# Patient Record
Sex: Female | Born: 1990 | Hispanic: Refuse to answer | Marital: Single | Smoking: Never smoker
Health system: Southern US, Community
[De-identification: ages and names within clinical notes are randomized; demographics above are authoritative.]

## PROBLEM LIST (undated history)

## (undated) DIAGNOSIS — J45909 Unspecified asthma, uncomplicated: Secondary | ICD-10-CM

---
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
## 2013-04-18 ENCOUNTER — Emergency Department: Payer: Self-pay | Admitting: Emergency Medicine

## 2014-08-04 ENCOUNTER — Encounter: Payer: Self-pay | Admitting: Internal Medicine

## 2014-08-04 DIAGNOSIS — K9041 Non-celiac gluten sensitivity: Secondary | ICD-10-CM | POA: Insufficient documentation

## 2014-10-04 ENCOUNTER — Encounter: Payer: Self-pay | Admitting: Internal Medicine

## 2014-10-04 ENCOUNTER — Ambulatory Visit (INDEPENDENT_AMBULATORY_CARE_PROVIDER_SITE_OTHER): Payer: 59 | Admitting: Internal Medicine

## 2014-10-04 VITALS — BP 96/62 | HR 68 | Ht 61.0 in | Wt 252.6 lb

## 2014-10-04 DIAGNOSIS — E669 Obesity, unspecified: Secondary | ICD-10-CM

## 2014-10-04 DIAGNOSIS — J4520 Mild intermittent asthma, uncomplicated: Secondary | ICD-10-CM | POA: Diagnosis not present

## 2014-10-04 DIAGNOSIS — Z0000 Encounter for general adult medical examination without abnormal findings: Secondary | ICD-10-CM | POA: Diagnosis not present

## 2014-10-04 DIAGNOSIS — Z30013 Encounter for initial prescription of injectable contraceptive: Secondary | ICD-10-CM

## 2014-10-04 MED ORDER — ALBUTEROL SULFATE HFA 108 (90 BASE) MCG/ACT IN AERS: 2.0000 | INHALATION_SPRAY | Freq: Four times a day (QID) | RESPIRATORY_TRACT | Status: DC | PRN

## 2014-10-04 MED ORDER — TRIAMCINOLONE ACETONIDE 0.5 % EX CREA: 1.0000 "application " | TOPICAL_CREAM | Freq: Two times a day (BID) | CUTANEOUS | Status: DC

## 2014-10-04 MED ORDER — MEDROXYPROGESTERONE ACETATE 150 MG/ML IM SUSP: 150.0000 mg | Freq: Once | INTRAMUSCULAR | Status: DC

## 2014-10-04 NOTE — Progress Notes (Signed)
Date:  10/04/2014   Name:  Cassandra Barajas   DOB:  03-09-1991   MRN:  [id number]   Chief Complaint: Annual Exam Cassandra Barajas is a 24 y.o. female who presents today for her Complete Annual Exam. She feels well. She reports exercising - walking every day. She reports she is sleeping well. She needs to start some type of contraception - condoms are not working out and she has had to take Plan B twice. She has used Depo Provera in the past with good effect.  She does not want another child for at least 5 years.  She is also planning to have bariatric surgery - gastric sleeve - in December and needs an EKG. Asthma There is no chest tightness, cough, difficulty breathing, shortness of breath or wheezing. This is a recurrent problem. The current episode started more than 1 year ago. The problem occurs intermittently. The problem has been waxing and waning. Pertinent negatives include no appetite change, chest pain, ear pain, fever, headaches, sore throat or trouble swallowing. Her symptoms are aggravated by change in weather. Her symptoms are alleviated by beta-agonist. Her past medical history is significant for asthma.  Rash This is a recurrent problem. The current episode started more than 1 year ago. The problem has been waxing and waning since onset. The affected locations include the abdomen. The rash is characterized by dryness, itchiness and scaling. Associated with: from belt buckles and snaps on jeans. Pertinent negatives include no cough, diarrhea, fever, shortness of breath or sore throat. Her past medical history is significant for asthma.     Review of Systems:  Review of Systems  Constitutional: Negative for fever, activity change, appetite change and unexpected weight change.  HENT: Negative for ear discharge, ear pain, hearing loss, sore throat and trouble swallowing.   Eyes: Negative for visual disturbance.  Respiratory: Negative for cough, choking, chest tightness, shortness of breath and  wheezing.   Cardiovascular: Negative for chest pain, palpitations and leg swelling.  Gastrointestinal: Negative for abdominal pain, diarrhea and constipation.  Genitourinary: Positive for pelvic pain (cramps with menses). Negative for dysuria, urgency, hematuria and vaginal discharge.  Musculoskeletal: Negative for back pain and arthralgias.  Skin: Positive for rash (psoriasis).  Neurological: Negative for numbness and headaches.  Hematological: Negative for adenopathy.  Psychiatric/Behavioral: Negative for sleep disturbance and dysphoric mood.    Patient Active Problem List   Diagnosis Date Noted  . Asthma, mild intermittent 08/04/2014  . NCGS (non-celiac gluten sensitivity) 08/04/2014    Prior to Admission medications   Medication Sig Start Date End Date Taking? Authorizing Provider  albuterol (PROVENTIL HFA;VENTOLIN HFA) 108 (90 BASE) MCG/ACT inhaler Inhale 2 puffs into the lungs 4 (four) times daily as needed. 08/13/13  Yes Historical Provider, MD  triamcinolone cream (KENALOG) 0.5 % Apply 1 application topically 2 (two) times daily. 08/13/13  Yes Historical Provider, MD    No Known Allergies  Past Surgical History  Procedure Laterality Date  . Cholecystectomy  09/2008    History  Substance Use Topics  . Smoking status: Never Smoker   . Smokeless tobacco: Not on file  . Alcohol Use: 0.0 oz/week    0 Standard drinks or equivalent per week     Medication list has been reviewed and updated.  Physical Examination:  Physical Exam  Constitutional: She is oriented to person, place, and time. She appears well-developed. No distress.  HENT:  Head: Normocephalic and atraumatic.  Mouth/Throat: Oropharynx is clear and moist.  Eyes: Conjunctivae are normal.  Pupils are equal, round, and reactive to light. Right eye exhibits no discharge. Left eye exhibits no discharge. No scleral icterus.  Neck: Neck supple. No thyromegaly present.  Cardiovascular: Normal rate, regular rhythm and  normal heart sounds.   No murmur heard. Pulmonary/Chest: Effort normal and breath sounds normal. No respiratory distress. She has no wheezes. She has no rales.  Abdominal: Soft. Bowel sounds are normal. There is no tenderness. There is no rebound and no guarding.  Genitourinary: Vagina normal. No breast swelling, tenderness, discharge (piercings of both nipples - intact) or bleeding. Pelvic exam was performed with patient supine. There is no tenderness or lesion on the right labia. There is no tenderness or lesion on the left labia. Cervix exhibits no motion tenderness. Right adnexum displays no tenderness and no fullness. Left adnexum displays no tenderness and no fullness.  Difficult to visualize cervix due to anterior position and depth of vaginal vault.  Several passes made with cervical broom.  Musculoskeletal: Normal range of motion. She exhibits no edema or tenderness.  Lymphadenopathy:    She has no cervical adenopathy.  Neurological: She is alert and oriented to person, place, and time. She has normal reflexes.  Skin: Skin is warm and dry. No rash noted.     Psychiatric: She has a normal mood and affect. Her speech is normal and behavior is normal. Thought content normal.  Nursing note and vitals reviewed.   BP 96/62 mmHg  Pulse 68  Ht 5\' 1"  (1.549 m)  Wt 252 lb 9.6 oz (114.579 kg)  BMI 47.75 kg/m2  Assessment and Plan: 1. Annual physical exam Pap obtained - unsure of the adequacy of specimen (may need to go to GYN) Mild dermatitis from nickel - use TAC as needed - POCT urinalysis dipstick - Pap IG (Image Guided) - Comprehensive metabolic panel - TSH - EKG 12-Lead - SR @ 71; low voltage otherwise normal with no prior to compare  2. Asthma, mild intermittent, uncomplicated stable - CBC with Differential/Platelet - albuterol (PROVENTIL HFA;VENTOLIN HFA) 108 (90 BASE) MCG/ACT inhaler; Inhale 2 puffs into the lungs 4 (four) times daily as needed.  Dispense: 18 g; Refill:  5  3. Obesity Plan for Gastric sleeve  4. Encounter for initial prescription of injectable contraceptive Pt will return with Rx - will need negative urine pregnancy test and then begin injection every 90 days - medroxyPROGESTERone (DEPO-PROVERA) 150 MG/ML injection; Inject 1 mL (150 mg total) into the muscle once.  Dispense: 1 mL; Refill: 3   Bari Edward, MD [facility]  10/04/2014

## 2014-10-04 NOTE — Patient Instructions (Signed)

## 2014-10-05 LAB — CBC WITH DIFFERENTIAL/PLATELET
Basophils Absolute: 0 10*3/uL (ref 0.0–0.2)
Basos: 0 %
EOS (ABSOLUTE): 0.5 10*3/uL — ABNORMAL HIGH (ref 0.0–0.4)
Eos: 4 %
Hematocrit: 36.6 % (ref 34.0–46.6)
Hemoglobin: 12.2 g/dL (ref 11.1–15.9)
Immature Grans (Abs): 0 10*3/uL (ref 0.0–0.1)
Immature Granulocytes: 0 %
Lymphocytes Absolute: 2.7 10*3/uL (ref 0.7–3.1)
Lymphs: 24 %
MCH: 26.5 pg — ABNORMAL LOW (ref 26.6–33.0)
MCHC: 33.3 g/dL (ref 31.5–35.7)
MCV: 79 fL (ref 79–97)
Monocytes Absolute: 0.7 10*3/uL (ref 0.1–0.9)
Monocytes: 6 %
Neutrophils Absolute: 7.2 10*3/uL — ABNORMAL HIGH (ref 1.4–7.0)
Neutrophils: 66 %
Platelets: 354 10*3/uL (ref 150–379)
RBC: 4.61 x10E6/uL (ref 3.77–5.28)
RDW: 15.1 % (ref 12.3–15.4)
WBC: 11.1 10*3/uL — ABNORMAL HIGH (ref 3.4–10.8)

## 2014-10-05 LAB — PAP IG (IMAGE GUIDED): PAP Smear Comment: 0

## 2014-10-05 LAB — COMPREHENSIVE METABOLIC PANEL
Chloride: 102 mmol/L (ref 97–108)
Potassium: 4.4 mmol/L (ref 3.5–5.2)

## 2014-10-05 LAB — TSH: TSH: 2.29 u[IU]/mL (ref 0.450–4.500)

## 2014-10-19 ENCOUNTER — Ambulatory Visit (INDEPENDENT_AMBULATORY_CARE_PROVIDER_SITE_OTHER): Payer: 59

## 2014-10-19 DIAGNOSIS — Z30013 Encounter for initial prescription of injectable contraceptive: Secondary | ICD-10-CM

## 2014-10-19 LAB — POCT URINE PREGNANCY: Preg Test, Ur: NEGATIVE

## 2014-10-19 MED ORDER — MEDROXYPROGESTERONE ACETATE 150 MG/ML IM SUSP
150.0000 mg | Freq: Once | INTRAMUSCULAR | Status: AC
  Administered 2014-10-19: 150 mg via INTRAMUSCULAR

## 2015-01-18 ENCOUNTER — Other Ambulatory Visit (INDEPENDENT_AMBULATORY_CARE_PROVIDER_SITE_OTHER): Payer: 59

## 2015-01-18 ENCOUNTER — Other Ambulatory Visit: Payer: Self-pay | Admitting: Internal Medicine

## 2015-01-18 DIAGNOSIS — Z30013 Encounter for initial prescription of injectable contraceptive: Secondary | ICD-10-CM

## 2015-01-18 DIAGNOSIS — Z3040 Encounter for surveillance of contraceptives, unspecified: Secondary | ICD-10-CM

## 2015-01-18 MED ORDER — MEDROXYPROGESTERONE ACETATE 150 MG/ML IM SUSP
150.0000 mg | Freq: Once | INTRAMUSCULAR | Status: AC
  Administered 2015-01-18: 150 mg via INTRAMUSCULAR

## 2015-01-19 ENCOUNTER — Other Ambulatory Visit: Payer: Self-pay

## 2015-04-20 ENCOUNTER — Other Ambulatory Visit: Payer: Self-pay | Admitting: Internal Medicine

## 2015-04-20 ENCOUNTER — Ambulatory Visit (INDEPENDENT_AMBULATORY_CARE_PROVIDER_SITE_OTHER): Payer: 59

## 2015-04-20 ENCOUNTER — Ambulatory Visit: Payer: Self-pay

## 2015-04-20 DIAGNOSIS — Z3042 Encounter for surveillance of injectable contraceptive: Secondary | ICD-10-CM

## 2015-04-20 DIAGNOSIS — N3281 Overactive bladder: Secondary | ICD-10-CM | POA: Insufficient documentation

## 2015-04-20 MED ORDER — OXYBUTYNIN CHLORIDE ER 5 MG PO TB24: 5.0000 mg | ORAL_TABLET | Freq: Every day | ORAL | Status: DC

## 2015-04-20 MED ORDER — MEDROXYPROGESTERONE ACETATE 150 MG/ML IM SUSP
150.0000 mg | Freq: Once | INTRAMUSCULAR | Status: AC
  Administered 2015-04-20: 150 mg via INTRAMUSCULAR

## 2015-04-20 NOTE — Addendum Note (Signed)
Addended by: Letta KocherHADLEY, Becker Christopher A on: 04/20/2015 11:51 AM   Modules accepted: SmartSet

## 2015-05-15 ENCOUNTER — Encounter
Admission: RE | Admit: 2015-05-15 | Discharge: 2015-05-15 | Disposition: A | Discharge status: Home / Self Care | Payer: BLUE CROSS/BLUE SHIELD | Source: Ambulatory Visit | Attending: Bariatrics | Admitting: Bariatrics

## 2015-05-15 DIAGNOSIS — Z01812 Encounter for preprocedural laboratory examination: Secondary | ICD-10-CM | POA: Insufficient documentation

## 2015-05-15 HISTORY — DX: Unspecified asthma, uncomplicated: J45.909

## 2015-05-15 NOTE — Patient Instructions (Signed)
  Your procedure is scheduled on: June 06, 2015 (Tuesday) Report to Day Surgery.[facility]) Second Floor To find out your arrival time please call [phone] between 1PM - 3PM on June 04, 2105 (Monday).  Remember: Instructions that are not followed completely may result in serious medical risk, up to and including death, or upon the discretion of your surgeon and anesthesiologist your surgery may need to be rescheduled.    __x__ 1. Do not eat food or drink liquids after midnight. No gum chewing or hard candies.     __x__ 2. No Alcohol for 24 hours before or after surgery.   ____ 3. Bring all medications with you on the day of surgery if instructed.    __x__ 4. Notify your doctor if there is any change in your medical condition     (cold, fever, infections).     Do not wear jewelry, make-up, hairpins, clips or nail polish.  Do not wear lotions, powders, or perfumes. You may wear deodorant.  Do not shave 48 hours prior to surgery. Men may shave face and neck.  Do not bring valuables to the hospital.    [facility] is not responsible for any belongings or valuables.               Contacts, dentures or bridgework may not be worn into surgery.  Leave your suitcase in the car. After surgery it may be brought to your room.  For patients admitted to the hospital, discharge time is determined by your                treatment team.   Patients discharged the day of surgery will not be allowed to drive home.   Please read over the following fact sheets that you were given:   Surgical Site Infection Prevention   ____ Take these medicines the morning of surgery with A SIP OF WATER:    1.       ____ Fleet Enema (as directed)   ____ Use CHG Soap as directed  __x__ Use inhalers on the day of surgery (Use Albuterol inhaler the morning of surgery and bring to hospital)  ____ Stop metformin 2 days prior to surgery    ____ Take 1/2 of usual insulin dose the night before surgery  and none on the morning of surgery.   ____ Stop Coumadin/Plavix/aspirin on   __x__ Stop Anti-inflammatories on  (NO NSAIDS)   ____ Stop supplements until after surgery.    ____ Bring C-Pap to the hospital.

## 2015-05-17 HISTORY — PX: OTHER SURGICAL HISTORY: SHX169

## 2015-05-19 NOTE — Pre-Procedure Instructions (Signed)
Several attempts to enter Emend order in epic  without success, pharmacy notified and stated Dr. Alva Garnet could enter the medication thru the data base or call the order  to [facility] pharmacist to enter the medication. Dr. Alva Garnet office notified concerning Emend (spoke to [place]).

## 2015-05-23 ENCOUNTER — Inpatient Hospital Stay: Payer: BLUE CROSS/BLUE SHIELD | Admitting: Anesthesiology

## 2015-05-23 ENCOUNTER — Inpatient Hospital Stay
Admission: RE | Admit: 2015-05-23 | Discharge: 2015-05-24 | DRG: 621 | Disposition: A | Discharge status: Home / Self Care | Payer: BLUE CROSS/BLUE SHIELD | Source: Ambulatory Visit | Attending: Bariatrics | Admitting: Bariatrics

## 2015-05-23 ENCOUNTER — Encounter
Admission: RE | Disposition: A | Discharge status: Home / Self Care | Payer: Self-pay | Source: Ambulatory Visit | Attending: Bariatrics

## 2015-05-23 DIAGNOSIS — K219 Gastro-esophageal reflux disease without esophagitis: Secondary | ICD-10-CM | POA: Diagnosis present

## 2015-05-23 DIAGNOSIS — Z7901 Long term (current) use of anticoagulants: Secondary | ICD-10-CM | POA: Diagnosis not present

## 2015-05-23 DIAGNOSIS — K449 Diaphragmatic hernia without obstruction or gangrene: Secondary | ICD-10-CM | POA: Diagnosis present

## 2015-05-23 DIAGNOSIS — Z5181 Encounter for therapeutic drug level monitoring: Secondary | ICD-10-CM

## 2015-05-23 DIAGNOSIS — Z6841 Body Mass Index (BMI) 40.0 and over, adult: Secondary | ICD-10-CM

## 2015-05-23 HISTORY — PX: LAPAROSCOPIC GASTRIC SLEEVE RESECTION WITH HIATAL HERNIA REPAIR: SHX6512

## 2015-05-23 LAB — TYPE AND SCREEN
ABO/RH(D): O POS
Antibody Screen: NEGATIVE

## 2015-05-23 LAB — HEMOGLOBIN AND HEMATOCRIT, BLOOD
HCT: 35.1 % (ref 35.0–47.0)
Hemoglobin: 11.9 g/dL — ABNORMAL LOW (ref 12.0–16.0)

## 2015-05-23 LAB — POCT PREGNANCY, URINE: Preg Test, Ur: NEGATIVE

## 2015-05-23 LAB — ABO/RH: ABO/RH(D): O POS

## 2015-05-23 SURGERY — GASTRECTOMY, SLEEVE, LAPAROSCOPIC, WITH HIATAL HERNIA REPAIR
Anesthesia: General | Wound class: Clean Contaminated

## 2015-05-23 MED ORDER — OXYBUTYNIN CHLORIDE ER 5 MG PO TB24
5.0000 mg | ORAL_TABLET | Freq: Every day | ORAL | Status: DC
  Administered 2015-05-23: 5 mg via ORAL
  Filled 2015-05-23: qty 1

## 2015-05-23 MED ORDER — TRIAMCINOLONE ACETONIDE 0.5 % EX CREA
1.0000 "application " | TOPICAL_CREAM | Freq: Two times a day (BID) | CUTANEOUS | Status: DC
  Filled 2015-05-23: qty 15

## 2015-05-23 MED ORDER — POTASSIUM CHLORIDE IN NACL 20-0.45 MEQ/L-% IV SOLN
INTRAVENOUS | Status: DC
  Administered 2015-05-23 – 2015-05-24 (×3): via INTRAVENOUS
  Filled 2015-05-23 (×8): qty 1000

## 2015-05-23 MED ORDER — SCOPOLAMINE 1 MG/3DAYS TD PT72
MEDICATED_PATCH | TRANSDERMAL | Status: AC
  Administered 2015-05-23: 1.5 mg via TRANSDERMAL
  Filled 2015-05-23: qty 1

## 2015-05-23 MED ORDER — BUPIVACAINE-EPINEPHRINE (PF) 0.25% -1:200000 IJ SOLN
INTRAMUSCULAR | Status: AC
  Filled 2015-05-23: qty 60

## 2015-05-23 MED ORDER — ACETAMINOPHEN 160 MG/5ML PO SOLN
650.0000 mg | ORAL | Status: DC | PRN
  Filled 2015-05-23: qty 20.3

## 2015-05-23 MED ORDER — FENTANYL CITRATE (PF) 100 MCG/2ML IJ SOLN
INTRAMUSCULAR | Status: AC
  Administered 2015-05-23: 25 ug via INTRAVENOUS
  Filled 2015-05-23: qty 2

## 2015-05-23 MED ORDER — PROPOFOL 10 MG/ML IV BOLUS
INTRAVENOUS | Status: DC | PRN
  Administered 2015-05-23: 180 mg via INTRAVENOUS

## 2015-05-23 MED ORDER — BUPIVACAINE-EPINEPHRINE (PF) 0.25% -1:200000 IJ SOLN
INTRAMUSCULAR | Status: DC | PRN
  Administered 2015-05-23: 45 mL

## 2015-05-23 MED ORDER — CEFAZOLIN SODIUM-DEXTROSE 2-3 GM-% IV SOLR
2.0000 g | Freq: Once | INTRAVENOUS | Status: AC
  Administered 2015-05-23: 2 g via INTRAVENOUS

## 2015-05-23 MED ORDER — LACTATED RINGERS IV SOLN
INTRAVENOUS | Status: DC
  Administered 2015-05-23: 11:00:00 via INTRAVENOUS

## 2015-05-23 MED ORDER — ONDANSETRON HCL 4 MG/2ML IJ SOLN
INTRAMUSCULAR | Status: DC | PRN
  Administered 2015-05-23: 4 mg via INTRAVENOUS

## 2015-05-23 MED ORDER — CETIRIZINE HCL 10 MG PO TABS
10.0000 mg | ORAL_TABLET | Freq: Every evening | ORAL | Status: DC
  Filled 2015-05-23: qty 1

## 2015-05-23 MED ORDER — PNEUMOCOCCAL VAC POLYVALENT 25 MCG/0.5ML IJ INJ
0.5000 mL | INJECTION | INTRAMUSCULAR | Status: AC
  Administered 2015-05-24: 0.5 mL via INTRAMUSCULAR
  Filled 2015-05-23: qty 0.5

## 2015-05-23 MED ORDER — HYDROMORPHONE HCL 1 MG/ML IJ SOLN
1.0000 mg | INTRAMUSCULAR | Status: DC | PRN
  Administered 2015-05-23: 2 mg via INTRAVENOUS
  Administered 2015-05-23 – 2015-05-24 (×3): 1 mg via INTRAVENOUS
  Filled 2015-05-23 (×2): qty 1
  Filled 2015-05-23: qty 2
  Filled 2015-05-23: qty 1

## 2015-05-23 MED ORDER — ALBUTEROL SULFATE (2.5 MG/3ML) 0.083% IN NEBU: 2.5000 mg | INHALATION_SOLUTION | Freq: Four times a day (QID) | RESPIRATORY_TRACT | Status: DC | PRN

## 2015-05-23 MED ORDER — ROCURONIUM BROMIDE 100 MG/10ML IV SOLN
INTRAVENOUS | Status: DC | PRN
  Administered 2015-05-23: 40 mg via INTRAVENOUS
  Administered 2015-05-23: 20 mg via INTRAVENOUS

## 2015-05-23 MED ORDER — PANTOPRAZOLE SODIUM 40 MG IV SOLR
40.0000 mg | Freq: Every day | INTRAVENOUS | Status: DC
  Administered 2015-05-23: 40 mg via INTRAVENOUS
  Filled 2015-05-23: qty 40

## 2015-05-23 MED ORDER — SUCCINYLCHOLINE CHLORIDE 20 MG/ML IJ SOLN
INTRAMUSCULAR | Status: DC | PRN
  Administered 2015-05-23: 100 mg via INTRAVENOUS

## 2015-05-23 MED ORDER — DEXAMETHASONE SODIUM PHOSPHATE 10 MG/ML IJ SOLN
INTRAMUSCULAR | Status: DC | PRN
  Administered 2015-05-23: 10 mg via INTRAVENOUS

## 2015-05-23 MED ORDER — SCOPOLAMINE 1 MG/3DAYS TD PT72
1.0000 | MEDICATED_PATCH | TRANSDERMAL | Status: DC
  Administered 2015-05-23: 1.5 mg via TRANSDERMAL

## 2015-05-23 MED ORDER — FENTANYL CITRATE (PF) 100 MCG/2ML IJ SOLN
25.0000 ug | INTRAMUSCULAR | Status: AC | PRN
  Administered 2015-05-23 (×6): 25 ug via INTRAVENOUS

## 2015-05-23 MED ORDER — PROMETHAZINE HCL 25 MG/ML IJ SOLN: 6.2500 mg | Freq: Once | INTRAMUSCULAR | Status: DC

## 2015-05-23 MED ORDER — DIPHENHYDRAMINE HCL 50 MG/ML IJ SOLN
25.0000 mg | Freq: Four times a day (QID) | INTRAMUSCULAR | Status: DC | PRN
  Administered 2015-05-23: 25 mg via INTRAVENOUS
  Filled 2015-05-23: qty 1

## 2015-05-23 MED ORDER — ONDANSETRON HCL 4 MG/2ML IJ SOLN
4.0000 mg | INTRAMUSCULAR | Status: DC | PRN
  Filled 2015-05-23: qty 2

## 2015-05-23 MED ORDER — NEOSTIGMINE METHYLSULFATE 10 MG/10ML IV SOLN
INTRAVENOUS | Status: DC | PRN
Start: 2015-05-23 — End: 2015-05-23
  Administered 2015-05-23: 4 mg via INTRAVENOUS

## 2015-05-23 MED ORDER — APREPITANT 40 MG PO CAPS: 40.0000 mg | ORAL_CAPSULE | Freq: Once | ORAL | Status: DC

## 2015-05-23 MED ORDER — ENALAPRILAT 1.25 MG/ML IV SOLN
1.2500 mg | Freq: Four times a day (QID) | INTRAVENOUS | Status: DC | PRN
  Filled 2015-05-23: qty 1

## 2015-05-23 MED ORDER — FENTANYL CITRATE (PF) 100 MCG/2ML IJ SOLN
INTRAMUSCULAR | Status: AC
  Filled 2015-05-23: qty 2

## 2015-05-23 MED ORDER — MIDAZOLAM HCL 2 MG/2ML IJ SOLN
INTRAMUSCULAR | Status: DC | PRN
  Administered 2015-05-23: 2 mg via INTRAVENOUS

## 2015-05-23 MED ORDER — GLYCOPYRROLATE 0.2 MG/ML IJ SOLN
INTRAMUSCULAR | Status: DC | PRN
  Administered 2015-05-23: 0.6 mg via INTRAVENOUS

## 2015-05-23 MED ORDER — FENTANYL CITRATE (PF) 100 MCG/2ML IJ SOLN
INTRAMUSCULAR | Status: DC | PRN
  Administered 2015-05-23: 100 ug via INTRAVENOUS
  Administered 2015-05-23: 150 ug via INTRAVENOUS
  Administered 2015-05-23: 50 ug via INTRAVENOUS

## 2015-05-23 MED ORDER — PREMIER PROTEIN SHAKE: 2.0000 [oz_av] | Freq: Four times a day (QID) | ORAL | Status: DC

## 2015-05-23 MED ORDER — HYDROCODONE-ACETAMINOPHEN 7.5-325 MG/15ML PO SOLN
5.0000 mL | ORAL | Status: DC | PRN
  Administered 2015-05-24: 10 mL via ORAL
  Filled 2015-05-23: qty 15

## 2015-05-23 MED ORDER — PROMETHAZINE HCL 25 MG/ML IJ SOLN: 12.5000 mg | Freq: Four times a day (QID) | INTRAMUSCULAR | Status: DC | PRN

## 2015-05-23 MED ORDER — PROMETHAZINE HCL 25 MG/ML IJ SOLN
INTRAMUSCULAR | Status: AC
  Filled 2015-05-23: qty 1

## 2015-05-23 MED ORDER — CEFAZOLIN SODIUM-DEXTROSE 2-3 GM-% IV SOLR
INTRAVENOUS | Status: AC
  Administered 2015-05-23: 2 g via INTRAVENOUS
  Filled 2015-05-23: qty 50

## 2015-05-23 MED ORDER — ENOXAPARIN SODIUM 40 MG/0.4ML ~~LOC~~ SOLN
40.0000 mg | Freq: Two times a day (BID) | SUBCUTANEOUS | Status: DC
  Administered 2015-05-24: 40 mg via SUBCUTANEOUS
  Filled 2015-05-23: qty 0.4

## 2015-05-23 MED ORDER — LIDOCAINE HCL (CARDIAC) 20 MG/ML IV SOLN
INTRAVENOUS | Status: DC | PRN
  Administered 2015-05-23: 10 mg via INTRAVENOUS

## 2015-05-23 MED ORDER — ONDANSETRON HCL 4 MG/2ML IJ SOLN
INTRAMUSCULAR | Status: AC
  Administered 2015-05-23: 4 mg via INTRAVENOUS
  Filled 2015-05-23: qty 2

## 2015-05-23 MED ORDER — ONDANSETRON HCL 4 MG/2ML IJ SOLN
4.0000 mg | Freq: Once | INTRAMUSCULAR | Status: AC | PRN
  Administered 2015-05-23: 4 mg via INTRAVENOUS

## 2015-05-23 SURGICAL SUPPLY — 42 items
APPLIER CLIP ROT 10 11.4 M/L (STAPLE)
BANDAGE ELASTIC 6 LF NS (GAUZE/BANDAGES/DRESSINGS) ×6 IMPLANT
BLADE SURG SZ11 CARB STEEL (BLADE) ×3 IMPLANT
CANISTER SUCT 1200ML W/VALVE (MISCELLANEOUS) ×3 IMPLANT
CHLORAPREP W/TINT 26ML (MISCELLANEOUS) ×6 IMPLANT
CLIP APPLIE ROT 10 11.4 M/L (STAPLE) IMPLANT
DEFOGGER SCOPE WARMER CLEARIFY (MISCELLANEOUS) ×3 IMPLANT
DRAPE UTILITY 15X26 TOWEL STRL (DRAPES) ×6 IMPLANT
ENDOLOOP SUT PDS II  0 18 (SUTURE)
ENDOLOOP SUT PDS II 0 18 (SUTURE) IMPLANT
FILTER LAP SMOKE EVAC STRL (MISCELLANEOUS) ×3 IMPLANT
GLOVE BIO SURGEON STRL SZ7 (GLOVE) ×6 IMPLANT
GLOVE BIOGEL PI IND STRL 8.5 (GLOVE) ×1 IMPLANT
GLOVE BIOGEL PI INDICATOR 8.5 (GLOVE) ×2
GLOVE SURG SYN 8.0 (GLOVE) ×3 IMPLANT
GOWN STRL REUS W/ TWL LRG LVL3 (GOWN DISPOSABLE) ×5 IMPLANT
GOWN STRL REUS W/TWL LRG LVL3 (GOWN DISPOSABLE) ×10
GRASPER SUT TROCAR 14GX15 (MISCELLANEOUS) ×3 IMPLANT
IRRIGATION STRYKERFLOW (MISCELLANEOUS) ×1 IMPLANT
IRRIGATOR STRYKERFLOW (MISCELLANEOUS) ×3
IV NS 1000ML (IV SOLUTION) ×2
IV NS 1000ML BAXH (IV SOLUTION) ×1 IMPLANT
KIT RM TURNOVER STRD PROC AR (KITS) ×3 IMPLANT
LABEL OR SOLS (LABEL) ×3 IMPLANT
LIQUID BAND (GAUZE/BANDAGES/DRESSINGS) ×6 IMPLANT
NDL SAFETY 22GX1.5 (NEEDLE) ×3 IMPLANT
NS IRRIG 500ML POUR BTL (IV SOLUTION) ×3 IMPLANT
PACK LAP CHOLECYSTECTOMY (MISCELLANEOUS) ×3 IMPLANT
RELOAD BLUE (STAPLE) ×9 IMPLANT
RELOAD GOLD (STAPLE) ×12 IMPLANT
RELOAD GREEN (STAPLE) ×3 IMPLANT
SHEARS HARMONIC ACE PLUS 45CM (MISCELLANEOUS) ×3 IMPLANT
SLEEVE ENDOPATH XCEL 5M (ENDOMECHANICALS) ×9 IMPLANT
SLEEVE GASTRECTOMY 36FR VISIGI (MISCELLANEOUS) ×3 IMPLANT
STAPLER ECHELON LONG 60 440 (INSTRUMENTS) ×3 IMPLANT
SUT DEVICE BRAIDED 0X39 (SUTURE) ×3 IMPLANT
SUT MNCRL AB 4-0 PS2 18 (SUTURE) ×3 IMPLANT
SUT VIC AB 0 CT2 27 (SUTURE) ×3 IMPLANT
SYR 20CC LL (SYRINGE) ×3 IMPLANT
TROCAR XCEL 12X100 BLDLESS (ENDOMECHANICALS) ×3 IMPLANT
TROCAR XCEL NON-BLD 5MMX100MML (ENDOMECHANICALS) ×3 IMPLANT
TUBING INSUFFLATOR HEATED (MISCELLANEOUS) ×3 IMPLANT

## 2015-05-23 NOTE — Transfer of Care (Signed)
Immediate Anesthesia Transfer of Care Note  Patient: Cassandra Barajas  Procedure(s) Performed: Procedure(s): LAPAROSCOPIC GASTRIC SLEEVE RESECTION WITH HIATAL HERNIA REPAIR (N/A)  Patient Location: PACU  Anesthesia Type:General  Level of Consciousness: patient cooperative and lethargic  Airway & Oxygen Therapy: Patient Spontanous Breathing and Patient connected to face mask oxygen  Post-op Assessment: Report given to RN and Post -op Vital signs reviewed and stable  Post vital signs: Reviewed and stable  Last Vitals:  Filed Vitals:   05/23/15 1021 05/23/15 1416  BP: 127/84 126/69  Pulse: 103 103  Temp: 37.2 C 36.3 C  Resp: 16 18    Complications: No apparent anesthesia complications

## 2015-05-23 NOTE — Brief Op Note (Signed)
05/23/2015  2:25 PM  PATIENT:  Cassandra Barajas  25 y.o. female  PRE-OPERATIVE DIAGNOSIS:  morbid obesity  POST-OPERATIVE DIAGNOSIS:  morbid obesity with Hiatal Hernia  PROCEDURE:  Procedure(s): LAPAROSCOPIC GASTRIC SLEEVE RESECTION WITH HIATAL HERNIA REPAIR (N/A)  SURGEON:  Surgeon(s) and Role:    * Everette Rank, MD - Primary  PHYSICIAN ASSISTANT: Waverley Krempasky, PA-C  ANESTHESIA:   general  EBL:  Total I/O In: 1200 [I.V.:1200] Out: 10 [Blood:10]  BLOOD ADMINISTERED:none  DRAINS: none   LOCAL MEDICATIONS USED:  MARCAINE     SPECIMEN:  Source of Specimen:  Greater Curvature of Stomach, Resected  DISPOSITION OF SPECIMEN:  PATHOLOGY  COUNTS:  YES  TOURNIQUET:  * No tourniquets in log *  DICTATION: .Note written in EPIC  PLAN OF CARE: Admit to inpatient   PATIENT DISPOSITION:  PACU - hemodynamically stable.   Delay start of Pharmacological VTE agent (>24hrs) due to surgical blood loss or risk of bleeding: no

## 2015-05-23 NOTE — OR Nursing (Signed)
Dr. Alva Garnet informed of patients weight discrepancy, the fact that she found out yesterday regarding surgery and has not started on the liquid protein diet.  Also, Emend is not a Surveyor, minerals medicine so we can not provide that for her.

## 2015-05-23 NOTE — Anesthesia Procedure Notes (Signed)
Procedure Name: Intubation Date/Time: 05/23/2015 12:17 PM Performed by: Omer Jack Pre-anesthesia Checklist: Patient identified, Patient being monitored, Timeout performed, Emergency Drugs available and Suction available Patient Re-evaluated:Patient Re-evaluated prior to inductionOxygen Delivery Method: Circle system utilized Preoxygenation: Pre-oxygenation with 100% oxygen Intubation Type: IV induction Ventilation: Mask ventilation without difficulty Laryngoscope Size: Mac and 3 Grade View: Grade I Tube type: Oral Tube size: 7.0 mm Number of attempts: 1 Placement Confirmation: ETT inserted through vocal cords under direct vision,  positive ETCO2 and breath sounds checked- equal and bilateral Secured at: 21 cm Tube secured with: Tape Dental Injury: Teeth and Oropharynx as per pre-operative assessment

## 2015-05-23 NOTE — Op Note (Signed)
PATIENT: Cassandra Barajas 1990-05-10  PROCEDURE PERFORMED: Procedure(s): LAPAROSCOPIC GASTRIC SLEEVE RESECTION WITH HIATAL HERNIA REPAIR (N/A) PRE-OP DIAGNOSIS: morbid obesity with GERD and hiatal hernia POST-OP DIAGNOSIS: same ESTIMATED BLOOD LOSS: less than 50 mL SURGEON: Everette Rank  ASSISTANT: Anabel Halon, PA   PROCEDURE IN DETAIL:The patient was brought to the operating room and placed in supine position. General anesthesia obtained with orotracheal intubation. The patient had TED hose and Thro mboguards applied. A footboard applied at the end of the operative bed. The chest and abdomen were sterilely prepped and draped. A 5 mm Optiview trocar was introduced in the left upper quadrant of the abdomen under direct visualization. Pneumoperitoneum obtained. Three additional trocars introduced across the upper abdomen and a Nathanson liver retractor introduced through a subxiphoid defect. On elevation of the left lobe of the liver, the prior noted hiatal hernia was identifiable, with significant indentation of the peritoneum at the level of the hiatus. The patient then had division of the gastrohepatic ligament and division of the peritoneum across the anterior hiatus. Blunt dissection was used to reduce herniated peritoneum and also mobilize the esophagus and anterior vagal nerve away from the overlying pericardium. The patient then had division of the peritoneum just lateral to the right crural margin. Blunt dissection used to reduce herniated lesser sac fatty tissue. The phrenoesophageal ligament was then divided on the medial aspect of the left crus. This was then followed by circumferential mobilization of the lower esophagus within the lower mediastinum. The esophagus and vagal nerves being swept away from the aorta, the pericardium, and the pleural surfaces. This was extended superiorly over a distance of approximately 8 cm, ultimately resulted in delivery of 2 cm of esophagus lying  comfortably in the abdominal cavity. At this time, a posterior crural repair was performed with 2 interrupted 0 Ethibond sutures. There remained tension on right crural muscle prompting release of fascia midway between the vena cava and upper margin of right crural margin, the release was initiated by harmonic scalpel exposing the underlying peripleural fat pad. The fascia was further released superiorly for one cm and the inferiorly parallel with the upper suture. The iatrogenic defect was noted to measure approximately 3 cm in length and open to a diameter approximately 1 1/2 cm. This resulted in significant reduction of tension at the suture line The defect was not felt to be necessary given the prominence of the left lobe of the liver. The pylorus was then marked and approximately 4 cm proximal to this, there was division of the vascular pedicles along the greater curvature of the stomach with the dissection extended superiorly over a distance of approximately 8 cm. Posterior attachments of the stomach to the pancreas were taken down by use of the Harmonic scalpel. This was then followed by a series of GIA staple firings to create a medially based gastric tube effect. First firing was a green load staple, placed in relative transverse direction in an effort to avoid narrowing of the incisura. A second gold load staple was also placed in a similar fashion. This was followed by introduction of a 16 [place] ViSiGi device into the stapled aspect of the antrum and the remaining portion of the stomach decompressed. The patient had additional firings of gold load staples brought out parallel to the lesser curvature of the stomach to create a medially-based gastric tube effect. As this was being performed, the ViSiGi device was gradually withdrawn to create a consistent tubular effect. The stomach was occluded distally and  with the ViSiGi device, insufflation of the stomach tube was performed. With a saline bath, no air  leak identified. The ViSiGi device was withdrawn. The residual vascular pedicles of the greater curvature of the stomach divided, freeing both the gastrosplenic and gastrocolic ligaments. The lateral stomach then retrieved through the right upper quadrant 12 mm trocar site. After confirming hemostasis along the staple line, the apex of the stomach was affixed to the left crus with 0 Ethibond suture to prevent proximal migration of the stomach into the lower mediastinum. The fascia and peritoneum of the 12 mm trocar site closed with 0 Vicryl suture delivered with needle suture passing system and used to close this site.The pneumoperitoneum  relieved, the trocars removed, the wounds  injected with 0.25% Marcaine and closed with 4-0 Monocryl in the dermis followed by Dermabond. Patient  allowed to recover at this point having tolerated the procedure well.

## 2015-05-23 NOTE — H&P (Signed)
History and physical on paper chart, no changes noted 

## 2015-05-23 NOTE — Anesthesia Preprocedure Evaluation (Addendum)
Anesthesia Evaluation  Patient identified by MRN, date of birth, ID band Patient awake    Reviewed: Allergy & Precautions, NPO status , Patient's Chart, lab work & pertinent test results  History of Anesthesia Complications Negative for: history of anesthetic complications  Airway Mallampati: II       Dental  (+) Teeth Intact   Pulmonary neg pulmonary ROS, asthma ,           Cardiovascular negative cardio ROS       Neuro/Psych negative neurological ROS     GI/Hepatic negative GI ROS, Neg liver ROS,   Endo/Other  negative endocrine ROS  Renal/GU negative Renal ROS  Female GU complaint     Musculoskeletal   Abdominal   Peds  Hematology negative hematology ROS (+)   Anesthesia Other Findings   Reproductive/Obstetrics                            Anesthesia Physical Anesthesia Plan  ASA: III  Anesthesia Plan: General   Post-op Pain Management:    Induction: Intravenous  Airway Management Planned: Oral ETT  Additional Equipment:   Intra-op Plan:   Post-operative Plan:   Informed Consent: I have reviewed the patients History and Physical, chart, labs and discussed the procedure including the risks, benefits and alternatives for the proposed anesthesia with the patient or authorized representative who has indicated his/her understanding and acceptance.     Plan Discussed with:   Anesthesia Plan Comments:         Anesthesia Quick Evaluation

## 2015-05-23 NOTE — Anesthesia Postprocedure Evaluation (Signed)
Anesthesia Post Note  Patient: Cassandra Barajas  Procedure(s) Performed: Procedure(s) (LRB): LAPAROSCOPIC GASTRIC SLEEVE RESECTION WITH HIATAL HERNIA REPAIR (N/A)  Patient location during evaluation: PACU Anesthesia Type: General Level of consciousness: awake and alert Pain management: pain level controlled Vital Signs Assessment: post-procedure vital signs reviewed and stable Respiratory status: spontaneous breathing and respiratory function stable Cardiovascular status: stable Anesthetic complications: no    Last Vitals:  Filed Vitals:   05/23/15 1516 05/23/15 1600  BP: 125/83 122/83  Pulse: 92 84  Temp: 36.4 C 37.1 C  Resp: 22 16    Last Pain:  Filed Vitals:   05/23/15 1601  PainSc: 2                  KEPHART,WILLIAM K

## 2015-05-23 NOTE — Progress Notes (Signed)
Anticoagulation monitoring(Lovenox):  25 yo  ordered Lovenox 30 mg Q12h  Filed Weights   05/23/15 1026 05/23/15 1043  Weight: 237 lb (107.502 kg) 255 lb 1 oz (115.696 kg)   BMI 46.2  No results found for: CREATININE CrCl cannot be calculated (Patient has no serum creatinine result on file.). Hemoglobin & Hematocrit     Component Value Date/Time   HCT 36.6 10/04/2014 1041     Per Protocol for Patient with estCrcl >  30 ml/min and BMI > 40, will transition to Lovenox  40 mg Q12 h based on BMI > 40.  Will need to confirm CrCl on 2/08 after AM labs drawn.

## 2015-05-23 NOTE — H&P (Signed)
Patient states she has had a sleep study done and the results for sleep apnea were negative.. She states she does not own a CPAP.  Patient refused CPAP 99% SPo2 on room air

## 2015-05-24 ENCOUNTER — Encounter: Payer: Self-pay | Admitting: Internal Medicine

## 2015-05-24 LAB — CBC WITH DIFFERENTIAL/PLATELET
Basophils Absolute: 0 10*3/uL (ref 0–0.1)
Basophils Relative: 0 %
Eosinophils Absolute: 0 10*3/uL (ref 0–0.7)
Eosinophils Relative: 0 %
HCT: 35 % (ref 35.0–47.0)
Hemoglobin: 11.7 g/dL — ABNORMAL LOW (ref 12.0–16.0)
Lymphocytes Relative: 13 %
Lymphs Abs: 2.3 10*3/uL (ref 1.0–3.6)
MCH: 26.6 pg (ref 26.0–34.0)
MCHC: 33.5 g/dL (ref 32.0–36.0)
MCV: 79.5 fL — ABNORMAL LOW (ref 80.0–100.0)
Monocytes Absolute: 1.5 10*3/uL — ABNORMAL HIGH (ref 0.2–0.9)
Monocytes Relative: 9 %
Neutro Abs: 13.6 10*3/uL — ABNORMAL HIGH (ref 1.4–6.5)
Neutrophils Relative %: 78 %
Platelets: 338 10*3/uL (ref 150–440)
RBC: 4.41 MIL/uL (ref 3.80–5.20)
RDW: 14.5 % (ref 11.5–14.5)
WBC: 17.4 10*3/uL — ABNORMAL HIGH (ref 3.6–11.0)

## 2015-05-24 LAB — COMPREHENSIVE METABOLIC PANEL
ALT: 44 U/L (ref 14–54)
AST: 44 U/L — ABNORMAL HIGH (ref 15–41)
Albumin: 4.1 g/dL (ref 3.5–5.0)
Alkaline Phosphatase: 73 U/L (ref 38–126)
Anion gap: 10 (ref 5–15)
BUN: 13 mg/dL (ref 6–20)
CO2: 20 mmol/L — ABNORMAL LOW (ref 22–32)
Calcium: 8.4 mg/dL — ABNORMAL LOW (ref 8.9–10.3)
Chloride: 103 mmol/L (ref 101–111)
Creatinine, Ser: 0.54 mg/dL (ref 0.44–1.00)
GFR calc Af Amer: >60 mL/min (ref >60)
GFR calc non Af Amer: >60 mL/min (ref >60)
Glucose, Bld: 93 mg/dL (ref 65–99)
Potassium: 4.1 mmol/L (ref 3.5–5.1)
Sodium: 133 mmol/L — ABNORMAL LOW (ref 135–145)
Total Bilirubin: 0.4 mg/dL (ref 0.3–1.2)
Total Protein: 7.5 g/dL (ref 6.5–8.1)

## 2015-05-24 MED ORDER — HYDROCODONE-ACETAMINOPHEN 7.5-325 MG/15ML PO SOLN: 5.0000 mL | ORAL | Status: DC | PRN

## 2015-05-24 MED ORDER — ENOXAPARIN SODIUM 40 MG/0.4ML ~~LOC~~ SOLN: 30.0000 mg | SUBCUTANEOUS | Status: DC

## 2015-05-24 NOTE — Discharge Summary (Signed)
Physician Discharge Summary  Patient ID: Cassandra Barajas MRN: [id number] DOB/AGE: 1991-03-03 24 y.o.  Admit date: 05/23/2015 Discharge date: 05/24/2015  Admission Diagnoses:morbid obesity with GERD/Hiatal hernia  Discharge Diagnoses:  Active Problems:   Morbid obesity due to excess calories (HCC)    Procedure(s): LAPAROSCOPIC GASTRIC SLEEVE RESECTION WITH HIATAL HERNIA REPAIR (N/A)  Discharged Condition: good  Hospital Course: tolerated operative procedure well and has been ambulatory and taking po well.  Consults: None  Significant Diagnostic Studies: none  Treatments: IV hydration  Discharge Exam: Blood pressure 108/66, pulse 78, temperature 98.4 F (36.9 C), temperature source Oral, resp. rate 17, height  (1.549 m), weight 108.138 kg (238 lb 6.4 oz), last menstrual period 10/11/2014, SpO2 96 %. General appearance: alert and cooperative Resp: clear to auscultation bilaterally GI: soft, non-tender; bowel sounds normal; no masses,  no organomegaly  Disposition: home  Discharge Instructions    Ambulate hourly while awake    Complete by:  As directed      Call MD for:  difficulty breathing, headache or visual disturbances    Complete by:  As directed      Call MD for:  persistant dizziness or light-headedness    Complete by:  As directed      Call MD for:  persistant nausea and vomiting    Complete by:  As directed      Call MD for:  redness, tenderness, or signs of infection (pain, swelling, redness, odor or green/yellow discharge around incision site)    Complete by:  As directed      Call MD for:  severe uncontrolled pain    Complete by:  As directed      Call MD for:  temperature >101 F    Complete by:  As directed      Diet bariatric full liquid    Complete by:  As directed      Discharge instructions    Complete by:  As directed   F/u in my office as scheduled     Incentive spirometry    Complete by:  As directed   Perform hourly while awake             Medication List    TAKE these medications        albuterol 108 (90 Base) MCG/ACT inhaler  Commonly known as:  PROVENTIL HFA;VENTOLIN HFA  Inhale 2 puffs into the lungs 4 (four) times daily as needed.     enoxaparin 40 MG/0.4ML injection  Commonly known as:  LOVENOX  Inject 0.3 mLs (30 mg total) into the skin daily.     EPIPEN 2-PAK 0.3 mg/0.3 mL Soaj injection  Generic drug:  EPINEPHrine  Inject 0.3 mg into the muscle as needed.     HYDROcodone-acetaminophen 7.5-325 mg/15 ml solution  Commonly known as:  HYCET  Take 5-10 mLs by mouth every 4 (four) hours as needed for moderate pain or severe pain.     levocetirizine 5 MG tablet  Commonly known as:  XYZAL  Take 5 mg by mouth every evening.     medroxyPROGESTERone 150 MG/ML injection  Commonly known as:  DEPO-PROVERA  Inject 1 mL (150 mg total) into the muscle once.     montelukast 10 MG tablet  Commonly known as:  SINGULAIR  Take 10 mg by mouth at bedtime.     oxybutynin 5 MG 24 hr tablet  Commonly known as:  DITROPAN XL  Take 1 tablet (5 mg total) by mouth at bedtime.  triamcinolone cream 0.5 %  Commonly known as:  KENALOG  Apply 1 application topically 2 (two) times daily.         Signed: Everette Rank 05/24/2015, 7:49 AM

## 2015-05-24 NOTE — Progress Notes (Signed)
INTERVENTION:  RD consulted for nutrition education regarding inpatient bariatric surgery.   RD provided "The Liquid Diet" handout from the Bariatric Surgery Guide from the [facility]. This handout previously provided to patient prior to surgery is a duplicate copy. Discussed what foods/liquids are consistent with a Clear Liquid Diet and reinforced Key Concepts such as no carbonation, no caffeine, or sugar containing beverages. Provided methods to prevent dehydration and promote protein intake, using clock and sample fluid schedule. RD encouraged follow-up with outpatient dietitian after discharge.  Teach back method used.  Expect good compliance.  NUTRITION DIAGNOSIS:  Food and nutrition knowledge related deficit related to recent bariatric surgery as evidenced by dietitian consult for nutrition education   GOAL:  Patient will be able to sip and tolerate CL within 24-48 hours  MONITOR:  Energy intake Digestive system  ASSESSMENT:  Pt s/p gastric sleeve.   Body mass index is 45.07 kg/(m^2).   Current diet order is clear liquids, patient is consuming approximately 30ml at this time.   Labs and medications reviewed.   LOW Care Level  Cassandra Barajas, RD, LDN [phone] (pager) Weekend/On-Call pager [phone])

## 2015-05-24 NOTE — Progress Notes (Signed)
05/24/2015 14:15  Cassandra Barajas to be D/C'd Home per MD order.  Discussed prescriptions and follow up appointments with the patient. Prescriptions given to patient, medication list explained in detail. Pt verbalized understanding.    Medication List    TAKE these medications        albuterol 108 (90 Base) MCG/ACT inhaler  Commonly known as:  PROVENTIL HFA;VENTOLIN HFA  Inhale 2 puffs into the lungs 4 (four) times daily as needed.     enoxaparin 40 MG/0.4ML injection  Commonly known as:  LOVENOX  Inject 0.3 mLs (30 mg total) into the skin daily.     EPIPEN 2-PAK 0.3 mg/0.3 mL Soaj injection  Generic drug:  EPINEPHrine  Inject 0.3 mg into the muscle as needed.     HYDROcodone-acetaminophen 7.5-325 mg/15 ml solution  Commonly known as:  HYCET  Take 5-10 mLs by mouth every 4 (four) hours as needed for moderate pain or severe pain.     levocetirizine 5 MG tablet  Commonly known as:  XYZAL  Take 5 mg by mouth every evening.     medroxyPROGESTERone 150 MG/ML injection  Commonly known as:  DEPO-PROVERA  Inject 1 mL (150 mg total) into the muscle once.     montelukast 10 MG tablet  Commonly known as:  SINGULAIR  Take 10 mg by mouth at bedtime.     oxybutynin 5 MG 24 hr tablet  Commonly known as:  DITROPAN XL  Take 1 tablet (5 mg total) by mouth at bedtime.     triamcinolone cream 0.5 %  Commonly known as:  KENALOG  Apply 1 application topically 2 (two) times daily.        Filed Vitals:   05/24/15 0817 05/24/15 1210  BP: 105/62 110/63  Pulse: 62 78  Temp:  97.9 F (36.6 C)  Resp:  18    Skin clean, dry and intact without evidence of skin break down, no evidence of skin tears noted. IV catheter discontinued intact. Site without signs and symptoms of complications. Dressing and pressure applied. Pt denies pain at this time. No complaints noted.  An After Visit Summary was printed and given to the patient. Patient escorted via WC, and D/C home via private  auto.  Bradly Chris

## 2015-05-25 LAB — SURGICAL PATHOLOGY

## 2015-07-19 ENCOUNTER — Ambulatory Visit (INDEPENDENT_AMBULATORY_CARE_PROVIDER_SITE_OTHER): Payer: 59

## 2015-07-19 DIAGNOSIS — Z3049 Encounter for surveillance of other contraceptives: Secondary | ICD-10-CM | POA: Diagnosis not present

## 2015-07-19 DIAGNOSIS — Z3042 Encounter for surveillance of injectable contraceptive: Secondary | ICD-10-CM

## 2015-07-19 MED ORDER — MEDROXYPROGESTERONE ACETATE 150 MG/ML IM SUSP
150.0000 mg | Freq: Once | INTRAMUSCULAR | Status: AC
  Administered 2015-07-19: 150 mg via INTRAMUSCULAR

## 2015-10-09 ENCOUNTER — Ambulatory Visit (INDEPENDENT_AMBULATORY_CARE_PROVIDER_SITE_OTHER): Payer: 59 | Admitting: Internal Medicine

## 2015-10-09 ENCOUNTER — Encounter: Payer: Self-pay | Admitting: Internal Medicine

## 2015-10-09 VITALS — BP 108/76 | HR 77 | Resp 16 | Ht 61.0 in | Wt 194.1 lb

## 2015-10-09 DIAGNOSIS — M5414 Radiculopathy, thoracic region: Secondary | ICD-10-CM | POA: Diagnosis not present

## 2015-10-09 DIAGNOSIS — Z0000 Encounter for general adult medical examination without abnormal findings: Secondary | ICD-10-CM

## 2015-10-09 DIAGNOSIS — N3281 Overactive bladder: Secondary | ICD-10-CM | POA: Diagnosis not present

## 2015-10-09 DIAGNOSIS — Z9884 Bariatric surgery status: Secondary | ICD-10-CM | POA: Insufficient documentation

## 2015-10-09 DIAGNOSIS — J4520 Mild intermittent asthma, uncomplicated: Secondary | ICD-10-CM

## 2015-10-09 LAB — POCT URINALYSIS DIPSTICK
Bilirubin, UA: 1.025
Blood, UA: NEGATIVE
Clarity, UA: NEGATIVE
Color, UA: NEGATIVE
Glucose, UA: NEGATIVE
Ketones, UA: NEGATIVE
Leukocytes, UA: NEGATIVE
Nitrite, UA: NEGATIVE
Protein, UA: NEGATIVE
Spec Grav, UA: 1.025
Urobilinogen, UA: 0.2
pH, UA: 6.5

## 2015-10-09 NOTE — Patient Instructions (Signed)
Melatonin over the counter for sleep  Breast Self-Awareness Practicing breast self-awareness may pick up problems early, prevent significant medical complications, and possibly save your life. By practicing breast self-awareness, you can become familiar with how your breasts look and feel and if your breasts are changing. This allows you to notice changes early. It can also offer you some reassurance that your breast health is good. One way to learn what is normal for your breasts and whether your breasts are changing is to do a breast self-exam. If you find a lump or something that was not present in the past, it is best to contact your caregiver right away. Other findings that should be evaluated by your caregiver include nipple discharge, especially if it is bloody; skin changes or reddening; areas where the skin seems to be pulled in (retracted); or new lumps and bumps. Breast pain is seldom associated with cancer (malignancy), but should also be evaluated by a caregiver. HOW TO PERFORM A BREAST SELF-EXAM The best time to examine your breasts is 5-7 days after your menstrual period is over. During menstruation, the breasts are lumpier, and it may be more difficult to pick up changes. If you do not menstruate, have reached menopause, or had your uterus removed (hysterectomy), you should examine your breasts at regular intervals, such as monthly. If you are breastfeeding, examine your breasts after a feeding or after using a breast pump. Breast implants do not decrease the risk for lumps or tumors, so continue to perform breast self-exams as recommended. Talk to your caregiver about how to determine the difference between the implant and breast tissue. Also, talk about the amount of pressure you should use during the exam. Over time, you will become more familiar with the variations of your breasts and more comfortable with the exam. A breast self-exam requires you to remove all your clothes above the  waist. 1. Look at your breasts and nipples. Stand in front of a mirror in a room with good lighting. With your hands on your hips, push your hands firmly downward. Look for a difference in shape, contour, and size from one breast to the other (asymmetry). Asymmetry includes puckers, dips, or bumps. Also, look for skin changes, such as reddened or scaly areas on the breasts. Look for nipple changes, such as discharge, dimpling, repositioning, or redness. 2. Carefully feel your breasts. This is best done either in the shower or tub while using soapy water or when flat on your back. Place the arm (on the side of the breast you are examining) above your head. Use the pads (not the fingertips) of your three middle fingers on your opposite hand to feel your breasts. Start in the underarm area and use  inch (2 cm) overlapping circles to feel your breast. Use 3 different levels of pressure (light, medium, and firm pressure) at each circle before moving to the next circle. The light pressure is needed to feel the tissue closest to the skin. The medium pressure will help to feel breast tissue a little deeper, while the firm pressure is needed to feel the tissue close to the ribs. Continue the overlapping circles, moving downward over the breast until you feel your ribs below your breast. Then, move one finger-width towards the center of the body. Continue to use the  inch (2 cm) overlapping circles to feel your breast as you move slowly up toward the collar bone (clavicle) near the base of the neck. Continue the up and down exam using  all 3 pressures until you reach the middle of the chest. Do this with each breast, carefully feeling for lumps or changes. 3.  Keep a written record with breast changes or normal findings for each breast. By writing this information down, you do not need to depend only on memory for size, tenderness, or location. Write down where you are in your menstrual cycle, if you are still  menstruating. Breast tissue can have some lumps or thick tissue. However, see your caregiver if you find anything that concerns you.  SEEK MEDICAL CARE IF:  You see a change in shape, contour, or size of your breasts or nipples.   You see skin changes, such as reddened or scaly areas on the breasts or nipples.   You have an unusual discharge from your nipples.   You feel a new lump or unusually thick areas.    This information is not intended to replace advice given to you by your health care provider. Make sure you discuss any questions you have with your health care provider.   Document Released: 04/01/2005 Document Revised: 03/18/2012 Document Reviewed: 07/17/2011 Elsevier Interactive Patient Education [employer].

## 2015-10-09 NOTE — Progress Notes (Signed)
Date:  10/09/2015   Name:  Cassandra Barajas   DOB:  07-Jun-1990   MRN:  [id number]   Chief Complaint: Annual Exam Cassandra Barajas is a 25 y.o. female who presents today for her Complete Annual Exam. She feels well. She reports exercising cardio three times a week, weights and walking the dog. She reports she is sleeping fairly well - only getting 4 hours per night.   Asthma She complains of chest tightness. There is no shortness of breath or wheezing. This is a recurrent problem. The problem occurs intermittently. The problem has been unchanged. Pertinent negatives include no chest pain, fever, headaches, myalgias or trouble swallowing. Her past medical history is significant for asthma.   Bariatric surgery - has gastric sleeve in February.  She has lost 60 lbs so far. Due for follow up and labs in several months  Mid back pain/numbness - Over the past several months she's noted mid back discomfort and numbness with certain positions. Symptoms come and go and are especially worse while driving or standing washing dishes. She went to urgent care and had a chest x-ray which was normal the prescribed capsaicin which only burned so she discontinued it. Because of her gastric bypass surgery she has not tried muscle relaxers or stronger anti-inflammatories.  Review of Systems  Constitutional: Negative for fever, chills and fatigue.  HENT: Negative for hearing loss and trouble swallowing.   Eyes: Negative for visual disturbance.  Respiratory: Negative for choking, chest tightness, shortness of breath and wheezing.   Cardiovascular: Negative for chest pain, palpitations and leg swelling.  Gastrointestinal: Negative for abdominal pain, diarrhea and constipation.  Genitourinary: Negative for urgency, vaginal bleeding, vaginal discharge, pelvic pain and dyspareunia.  Musculoskeletal: Positive for back pain. Negative for myalgias, arthralgias, neck pain and neck stiffness.  Skin: Negative for color change  and rash.  Neurological: Positive for numbness. Negative for syncope, weakness and headaches.  Hematological: Negative for adenopathy.  Psychiatric/Behavioral: Positive for sleep disturbance. Negative for dysphoric mood and decreased concentration. The patient is not nervous/anxious.     Patient Active Problem List   Diagnosis Date Noted  . S/P bariatric surgery 10/09/2015  . Thoracic radiculopathy 10/09/2015  . OAB (overactive bladder) 04/20/2015  . Asthma, mild intermittent 08/04/2014  . NCGS (non-celiac gluten sensitivity) 08/04/2014    Prior to Admission medications   Medication Sig Start Date End Date Taking? Authorizing Provider  albuterol (PROVENTIL HFA;VENTOLIN HFA) 108 (90 BASE) MCG/ACT inhaler Inhale 2 puffs into the lungs 4 (four) times daily as needed. 10/04/14  Yes Reubin MilanLaura H Ceasar Decandia, MD  enoxaparin (LOVENOX) 40 MG/0.4ML injection Inject 0.3 mLs (30 mg total) into the skin daily. 05/24/15  Yes Everette RankMichael A Tyner, MD  levocetirizine (XYZAL) 5 MG tablet Take 5 mg by mouth every evening.   Yes Historical Provider, MD  medroxyPROGESTERone (DEPO-PROVERA) 150 MG/ML injection Inject 1 mL (150 mg total) into the muscle once. 10/04/14  Yes Reubin MilanLaura H Sherine Cortese, MD  oxybutynin (DITROPAN-XL) 5 MG 24 hr tablet Take 1 tablet (5 mg total) by mouth at bedtime. 04/20/15  Yes Reubin MilanLaura H Alexanderia Gorby, MD  triamcinolone cream (KENALOG) 0.5 % Apply 1 application topically 2 (two) times daily. 10/04/14  Yes Reubin MilanLaura H Urian Martenson, MD    Allergies  Allergen Reactions  . Shellfish Allergy Swelling    "swelling of lips and tongue"    Past Surgical History  Procedure Laterality Date  . Cholecystectomy  09/2008  . Gastric sleeve with hh repair  05/2015  . Laparoscopic  gastric sleeve resection with hiatal hernia repair N/A 05/23/2015    Procedure: LAPAROSCOPIC GASTRIC SLEEVE RESECTION WITH HIATAL HERNIA REPAIR;  Surgeon: Everette RankMichael A Tyner, MD;  Location: [facility] ORS;  Service: General;  Laterality: N/A;    Social History    Substance Use Topics  . Smoking status: Never Smoker   . Smokeless tobacco: Never Used  . Alcohol Use: 0.0 oz/week    0 Standard drinks or equivalent per week     Comment: occ.     Medication list has been reviewed and updated.   Physical Exam  Constitutional: She is oriented to person, place, and time. She appears well-developed and well-nourished. No distress.  HENT:  Head: Normocephalic and atraumatic.  Right Ear: Tympanic membrane and ear canal normal.  Left Ear: Tympanic membrane and ear canal normal.  Nose: Right sinus exhibits no maxillary sinus tenderness. Left sinus exhibits no maxillary sinus tenderness.  Mouth/Throat: Uvula is midline and oropharynx is clear and moist.  Eyes: Conjunctivae and EOM are normal. Right eye exhibits no discharge. Left eye exhibits no discharge. No scleral icterus.  Neck: Normal range of motion. Carotid bruit is not present. No erythema present. No thyromegaly present.  Cardiovascular: Normal rate, regular rhythm, normal heart sounds and normal pulses.   Pulmonary/Chest: Effort normal. No respiratory distress. She has no wheezes. Right breast exhibits no inverted nipple (piercing present), no mass, no nipple discharge, no skin change and no tenderness. Left breast exhibits no inverted nipple (piercing present), no mass, no nipple discharge, no skin change and no tenderness.  Abdominal: Soft. Bowel sounds are normal. There is no hepatosplenomegaly. There is no tenderness. There is no CVA tenderness.  Musculoskeletal: Normal range of motion.       Thoracic back: She exhibits normal range of motion, no tenderness, no bony tenderness and no spasm.  Lymphadenopathy:    She has no cervical adenopathy.    She has no axillary adenopathy.  Neurological: She is alert and oriented to person, place, and time. She has normal reflexes. No cranial nerve deficit or sensory deficit.  Skin: Skin is warm, dry and intact. No rash noted.  Psychiatric: She has a  normal mood and affect. Her speech is normal and behavior is normal. Thought content normal.  Nursing note and vitals reviewed.   BP 108/76 mmHg  Pulse 77  Resp 16  Ht 5\' 1"  (1.549 m)  Wt 194 lb 1.6 oz (88.043 kg)  BMI 36.69 kg/m2  SpO2 98%  LMP 05/17/2015 (Approximate)  Assessment and Plan: 1. Annual physical exam Ask Surgeon to draw TSH with next labs Melatonin as HS to aid sleep Pap due in 2019 - POCT urinalysis dipstick  2. OAB (overactive bladder) Doing well on medication  3. Asthma, mild intermittent, uncomplicated Mild; not requiring daily therapy  4. Thoracic radiculopathy Will get xrays to rule out bone spur or disc Recommend heat/ice and tylenol; stretching - DG Thoracic Spine W/Swimmers; Future  5. S/P bariatric surgery Doing well   Bari EdwardLaura Nikkita Adeyemi, MD [facility]  10/09/2015

## 2015-10-10 ENCOUNTER — Ambulatory Visit: Payer: 59

## 2015-10-11 ENCOUNTER — Ambulatory Visit
Admission: RE | Admit: 2015-10-11 | Discharge: 2015-10-11 | Disposition: A | Discharge status: Home / Self Care | Payer: BLUE CROSS/BLUE SHIELD | Source: Ambulatory Visit | Attending: Internal Medicine | Admitting: Internal Medicine

## 2015-10-11 DIAGNOSIS — M5414 Radiculopathy, thoracic region: Secondary | ICD-10-CM | POA: Insufficient documentation

## 2015-10-18 ENCOUNTER — Ambulatory Visit (INDEPENDENT_AMBULATORY_CARE_PROVIDER_SITE_OTHER): Payer: 59

## 2015-10-18 DIAGNOSIS — Z3042 Encounter for surveillance of injectable contraceptive: Secondary | ICD-10-CM

## 2015-10-18 MED ORDER — MEDROXYPROGESTERONE ACETATE 150 MG/ML IM SUSP
150.0000 mg | Freq: Once | INTRAMUSCULAR | Status: AC
  Administered 2015-10-18: 150 mg via INTRAMUSCULAR

## 2015-11-03 ENCOUNTER — Encounter: Payer: Self-pay | Admitting: Internal Medicine

## 2015-12-03 ENCOUNTER — Other Ambulatory Visit: Payer: Self-pay | Admitting: Internal Medicine

## 2015-12-03 DIAGNOSIS — N3281 Overactive bladder: Secondary | ICD-10-CM

## 2016-01-10 ENCOUNTER — Other Ambulatory Visit: Payer: Self-pay | Admitting: Internal Medicine

## 2016-01-10 DIAGNOSIS — Z30013 Encounter for initial prescription of injectable contraceptive: Secondary | ICD-10-CM

## 2016-01-18 ENCOUNTER — Ambulatory Visit: Payer: Self-pay

## 2016-01-19 ENCOUNTER — Ambulatory Visit: Payer: Self-pay

## 2016-01-22 ENCOUNTER — Ambulatory Visit: Payer: Self-pay

## 2016-03-22 ENCOUNTER — Other Ambulatory Visit: Payer: Self-pay | Admitting: Internal Medicine

## 2016-03-22 DIAGNOSIS — Z30013 Encounter for initial prescription of injectable contraceptive: Secondary | ICD-10-CM

## 2016-03-27 ENCOUNTER — Other Ambulatory Visit: Payer: Self-pay | Admitting: Internal Medicine

## 2016-03-27 ENCOUNTER — Telehealth: Payer: Self-pay | Admitting: Internal Medicine

## 2016-03-27 DIAGNOSIS — Z30013 Encounter for initial prescription of injectable contraceptive: Secondary | ICD-10-CM

## 2016-03-27 MED ORDER — MEDROXYPROGESTERONE ACETATE 150 MG/ML IM SUSY: 150.0000 mg | PREFILLED_SYRINGE | INTRAMUSCULAR | 0 refills | Status: DC

## 2016-03-27 NOTE — Telephone Encounter (Signed)
error 

## 2016-03-27 NOTE — Telephone Encounter (Signed)
Pt wants refill on Depo shots..pt have not been seen for 4 months. Called pt to let her know she need to be seen by Dr. Judithann GravesBerglund to have pregnancy test in the office and before we can give her Depo shot.

## 2016-03-29 ENCOUNTER — Encounter: Payer: Self-pay | Admitting: Internal Medicine

## 2016-03-29 ENCOUNTER — Ambulatory Visit (INDEPENDENT_AMBULATORY_CARE_PROVIDER_SITE_OTHER): Payer: 59 | Admitting: Internal Medicine

## 2016-03-29 VITALS — BP 108/65 | HR 86 | Temp 98.6°F | Ht 61.0 in | Wt 189.0 lb

## 2016-03-29 DIAGNOSIS — Z23 Encounter for immunization: Secondary | ICD-10-CM

## 2016-03-29 DIAGNOSIS — B354 Tinea corporis: Secondary | ICD-10-CM | POA: Diagnosis not present

## 2016-03-29 DIAGNOSIS — Z308 Encounter for other contraceptive management: Secondary | ICD-10-CM | POA: Diagnosis not present

## 2016-03-29 LAB — POCT URINE PREGNANCY: Preg Test, Ur: NEGATIVE

## 2016-03-29 MED ORDER — MEDROXYPROGESTERONE ACETATE 150 MG/ML IM SUSP
150.0000 mg | Freq: Once | INTRAMUSCULAR | Status: AC
  Administered 2016-03-29: 150 mg via INTRAMUSCULAR

## 2016-03-29 NOTE — Progress Notes (Signed)
Date:  03/29/2016   Name:  Cassandra BrackenKarla Barajas   DOB:  [birth year]   MRN:  [id number]   Chief Complaint: Injections (Depo shot) Rash  This is a recurrent problem. The current episode started more than 1 month ago. The problem has been waxing and waning since onset. Pertinent negatives include no fatigue, fever or shortness of breath.  Contraception - she missed her last dose of DEPO due to rescheduling and then misplacing it. She just decided to skip that dose.  She started her period several days ago so does not think she is pregnant.  She has had some tender breasts but no mass or nipple discharge.    Review of Systems  Constitutional: Negative for chills, fatigue and fever.  Respiratory: Negative for chest tightness and shortness of breath.   Cardiovascular: Negative for chest pain and leg swelling.  Genitourinary: Negative for menstrual problem.  Musculoskeletal: Negative for arthralgias.  Skin: Positive for rash.  Allergic/Immunologic: Positive for environmental allergies.  Psychiatric/Behavioral: Negative for dysphoric mood. The patient is not nervous/anxious.     Patient Active Problem List   Diagnosis Date Noted  . S/P bariatric surgery 10/09/2015  . Thoracic radiculopathy 10/09/2015  . OAB (overactive bladder) 04/20/2015  . Asthma, mild intermittent 08/04/2014  . NCGS (non-celiac gluten sensitivity) 08/04/2014    Prior to Admission medications   Medication Sig Start Date End Date Taking? Authorizing Provider  albuterol (PROVENTIL HFA;VENTOLIN HFA) 108 (90 BASE) MCG/ACT inhaler Inhale 2 puffs into the lungs 4 (four) times daily as needed. 10/04/14  Yes Reubin MilanLaura H Jamillia Closson, MD  levocetirizine (XYZAL) 5 MG tablet Take 5 mg by mouth every evening.   Yes Historical Provider, MD  MedroxyPROGESTERone Acetate 150 MG/ML SUSY Inject 1 mL (150 mg total) into the muscle every 3 (three) months. 03/27/16  Yes Reubin MilanLaura H Micaella Gitto, MD  montelukast (SINGULAIR) 10 MG tablet Take 1 tablet by mouth  daily. 03/17/16  Yes Historical Provider, MD  oxybutynin (DITROPAN-XL) 5 MG 24 hr tablet TAKE 1 TABLET(5 MG) BY MOUTH AT BEDTIME 12/03/15  Yes Reubin MilanLaura H Amahri Dengel, MD  triamcinolone cream (KENALOG) 0.5 % Apply 1 application topically 2 (two) times daily. 10/04/14  Yes Reubin MilanLaura H Jeremaih Klima, MD    Allergies  Allergen Reactions  . Shellfish Allergy Swelling    "swelling of lips and tongue"    Past Surgical History:  Procedure Laterality Date  . CHOLECYSTECTOMY  09/2008  . Gastric sleeve with HH repair  05/2015  . LAPAROSCOPIC GASTRIC SLEEVE RESECTION WITH HIATAL HERNIA REPAIR N/A 05/23/2015   Procedure: LAPAROSCOPIC GASTRIC SLEEVE RESECTION WITH HIATAL HERNIA REPAIR;  Surgeon: Everette RankMichael A Tyner, MD;  Location: [facility] ORS;  Service: General;  Laterality: N/A;    Social History  Substance Use Topics  . Smoking status: Never Smoker  . Smokeless tobacco: Never Used  . Alcohol use 0.0 oz/week     Comment: occ.     Medication list has been reviewed and updated.   Physical Exam  Constitutional: She is oriented to person, place, and time. She appears well-developed. No distress.  HENT:  Head: Normocephalic and atraumatic.  Neck: Normal range of motion. Neck supple.  Cardiovascular: Normal rate, regular rhythm and normal heart sounds.   Pulmonary/Chest: Effort normal and breath sounds normal. No respiratory distress. She has no wheezes.  Musculoskeletal: She exhibits no edema.  Neurological: She is alert and oriented to person, place, and time.  Skin: Skin is warm and dry. No rash noted.  Psychiatric: She has a  normal mood and affect. Her behavior is normal. Thought content normal.  Nursing note and vitals reviewed.   BP 108/65   Pulse 86   Temp 98.6 F (37 C)   Ht 5\' 1"  (1.549 m)   Wt 189 lb (85.7 kg)   SpO2 97%   BMI 35.71 kg/m   Assessment and Plan: 1. Encounter for other contraceptive management DEPO given - call for refill and injection in three months - POCT urine pregnancy  2.  Tinea corporis Use over the counter anti-fungals   Bari EdwardLaura Kaelum Kissick, MD [facility]  03/29/2016

## 2016-03-29 NOTE — Addendum Note (Signed)
Addended by: Shelly BombardPOLK, ANNA M on: 03/29/2016 08:40 AM   Modules accepted: Orders

## 2016-03-29 NOTE — Patient Instructions (Signed)
Use over the counter anti-fungals as needed for groin rash.

## 2016-05-09 ENCOUNTER — Emergency Department: Payer: 59

## 2016-05-09 ENCOUNTER — Encounter: Payer: Self-pay | Admitting: *Deleted

## 2016-05-09 ENCOUNTER — Emergency Department
Admission: EM | Admit: 2016-05-09 | Discharge: 2016-05-09 | Disposition: A | Discharge status: Home / Self Care | Payer: 59 | Attending: Emergency Medicine | Admitting: Emergency Medicine

## 2016-05-09 DIAGNOSIS — J45909 Unspecified asthma, uncomplicated: Secondary | ICD-10-CM | POA: Diagnosis not present

## 2016-05-09 DIAGNOSIS — Y92410 Unspecified street and highway as the place of occurrence of the external cause: Secondary | ICD-10-CM | POA: Diagnosis not present

## 2016-05-09 DIAGNOSIS — Y999 Unspecified external cause status: Secondary | ICD-10-CM | POA: Insufficient documentation

## 2016-05-09 DIAGNOSIS — S161XXA Strain of muscle, fascia and tendon at neck level, initial encounter: Secondary | ICD-10-CM | POA: Insufficient documentation

## 2016-05-09 DIAGNOSIS — Y9389 Activity, other specified: Secondary | ICD-10-CM | POA: Insufficient documentation

## 2016-05-09 DIAGNOSIS — S29011A Strain of muscle and tendon of front wall of thorax, initial encounter: Secondary | ICD-10-CM | POA: Diagnosis not present

## 2016-05-09 DIAGNOSIS — S199XXA Unspecified injury of neck, initial encounter: Secondary | ICD-10-CM | POA: Diagnosis present

## 2016-05-09 DIAGNOSIS — R0789 Other chest pain: Secondary | ICD-10-CM

## 2016-05-09 DIAGNOSIS — Z79899 Other long term (current) drug therapy: Secondary | ICD-10-CM | POA: Insufficient documentation

## 2016-05-09 MED ORDER — NAPROXEN 500 MG PO TABS: 500.0000 mg | ORAL_TABLET | Freq: Two times a day (BID) | ORAL | 0 refills | Status: DC

## 2016-05-09 MED ORDER — BACLOFEN 10 MG PO TABS: 10.0000 mg | ORAL_TABLET | Freq: Three times a day (TID) | ORAL | 0 refills | Status: DC
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
## 2016-06-21 ENCOUNTER — Other Ambulatory Visit: Payer: Self-pay | Admitting: Internal Medicine

## 2016-06-21 DIAGNOSIS — N3281 Overactive bladder: Secondary | ICD-10-CM

## 2016-06-26 ENCOUNTER — Other Ambulatory Visit: Payer: Self-pay

## 2016-06-26 DIAGNOSIS — Z30013 Encounter for initial prescription of injectable contraceptive: Secondary | ICD-10-CM

## 2016-06-26 MED ORDER — MEDROXYPROGESTERONE ACETATE 150 MG/ML IM SUSY: 150.0000 mg | PREFILLED_SYRINGE | INTRAMUSCULAR | 0 refills | Status: DC

## 2016-06-27 ENCOUNTER — Ambulatory Visit (INDEPENDENT_AMBULATORY_CARE_PROVIDER_SITE_OTHER): Payer: 59

## 2016-06-27 DIAGNOSIS — Z309 Encounter for contraceptive management, unspecified: Secondary | ICD-10-CM

## 2016-06-27 MED ORDER — MEDROXYPROGESTERONE ACETATE 150 MG/ML IM SUSP
150.0000 mg | Freq: Once | INTRAMUSCULAR | Status: AC
  Administered 2016-06-27: 150 mg via INTRAMUSCULAR

## 2016-08-07 ENCOUNTER — Other Ambulatory Visit: Payer: Self-pay | Admitting: Internal Medicine

## 2016-08-07 DIAGNOSIS — N3281 Overactive bladder: Secondary | ICD-10-CM

## 2016-09-19 ENCOUNTER — Telehealth: Payer: Self-pay

## 2016-09-19 ENCOUNTER — Other Ambulatory Visit: Payer: Self-pay | Admitting: Internal Medicine

## 2016-09-19 DIAGNOSIS — N3281 Overactive bladder: Secondary | ICD-10-CM

## 2016-09-19 NOTE — Telephone Encounter (Signed)
Pt calling to request referral to Urology. Stated pills given at last visit are not working and the urgency to urinate is so strong she is no longer sleeping.

## 2016-09-25 ENCOUNTER — Other Ambulatory Visit: Payer: Self-pay | Admitting: Internal Medicine

## 2016-09-25 DIAGNOSIS — Z30013 Encounter for initial prescription of injectable contraceptive: Secondary | ICD-10-CM

## 2016-09-26 ENCOUNTER — Ambulatory Visit: Payer: Self-pay

## 2016-09-27 ENCOUNTER — Ambulatory Visit (INDEPENDENT_AMBULATORY_CARE_PROVIDER_SITE_OTHER): Payer: 59

## 2016-09-27 DIAGNOSIS — Z3042 Encounter for surveillance of injectable contraceptive: Secondary | ICD-10-CM

## 2016-09-27 MED ORDER — MEDROXYPROGESTERONE ACETATE 150 MG/ML IM SUSP
150.0000 mg | Freq: Once | INTRAMUSCULAR | Status: AC
  Administered 2016-09-27: 150 mg via INTRAMUSCULAR

## 2016-10-11 ENCOUNTER — Ambulatory Visit: Payer: Self-pay | Admitting: Urology

## 2016-10-30 NOTE — Progress Notes (Deleted)
11/01/2016 9:10 AM   Cassandra Barajas [birth year] [id number]  Referring provider: Reubin Milan, MD [address] [place], [place] [address]  No chief complaint on file.   HPI: Patient is a 26 -year-old Caucasian female who is referred to [place] by, Dr. Bari Edward, for urinary incontinence.  Patient states that she has had urinary incontinence for ***.  Patient has incontinence with ***.   She is experiencing *** incontinent episodes during the day. She is experiencing *** incontinent episodes during the night.  Her incontinence volume is ***.   She is wearing *** pads/depends daily.    She is having associated urinary frequency, urgency, dysuria, nocturia, intermittency, hesitancy, straining to urinate and weak urinary stream.   ***  She is not having associated urinary frequency, urgency, dysuria, nocturia, intermittency, hesitancy, straining to urinate and weak urinary stream.   ***  She does/does not have a history of urinary tract infections, STI's or injury to the bladder. ***  She denies/endorses dysuria, gross hematuria, suprapubic pain, back pain, abdominal pain or flank pain.***  She denies/endorses dysuria, gross hematuria, suprapubic pain, back pain, abdominal pain or flank pain.***  She has not had any recent fevers, chills, nausea or vomiting. ***  She has not had any recent fevers, chills, nausea or vomiting. ***  She does/does not have a history of nephrolithiasis, GU surgery or GU trauma. ***  She does/does not have a history of nephrolithiasis, GU surgery or GU trauma. ***  She is/is not sexually active.  She has/has not noted incontinence with sexual intercourse.  ***   She is post menopausal. ***  She admits to/denies constipation and/or diarrhea. ***  She is having/ not having pain with bladder filling.  ***  She has/not had any recent imaging studies.  ***  She is drinking *** of water daily.   She is drinking *** caffeinated  beverages daily.  She is drinking *** alcoholic beverages daily.    Her risk factors for incontinence are obesity, para T, vaginal delivery, a family history of incontinence, age, smoking, caffeine, diabetes, stroke, depression, fecal incontinence, vaginal atrophy, oral estrogens, pelvic surgery, pelvic radiation and/or neurological disorders. ***  She is taking antihistamines, decongestants, benzo's, opioids, OAB medication, ACE inhibitors, alpha-agonists, alpha blockers, antiarrhythmics, diuretics, antidepressants, antipsychotics, skeletal muscle relaxants and/or oral estrogen.         PMH: Past Medical History:  Diagnosis Date  . Asthma     Surgical History: Past Surgical History:  Procedure Laterality Date  . CHOLECYSTECTOMY  09/2008  . Gastric sleeve with HH repair  05/2015  . LAPAROSCOPIC GASTRIC SLEEVE RESECTION WITH HIATAL HERNIA REPAIR N/A 05/23/2015   Procedure: LAPAROSCOPIC GASTRIC SLEEVE RESECTION WITH HIATAL HERNIA REPAIR;  Surgeon: Cassandra Rank, MD;  Location: [facility] ORS;  Service: General;  Laterality: N/A;    Home Medications:  Allergies as of 11/01/2016      Reactions   Shellfish Allergy Swelling   "swelling of lips and tongue"      Medication List       Accurate as of 10/30/16  9:10 AM. Always use your most recent med list.          albuterol 108 (90 Base) MCG/ACT inhaler Commonly known as:  PROVENTIL HFA;VENTOLIN HFA Inhale 2 puffs into the lungs 4 (four) times daily as needed.   baclofen 10 MG tablet Commonly known as:  LIORESAL Take 1 tablet (10 mg total) by mouth 3 (three) times daily.   levocetirizine 5  MG tablet Commonly known as:  XYZAL Take 5 mg by mouth every evening.   MedroxyPROGESTERone Acetate 150 MG/ML Susy ADMINISTER 1 ML(150 MG) IN THE MUSCLE EVERY 3 MONTHS   montelukast 10 MG tablet Commonly known as:  SINGULAIR Take 1 tablet by mouth daily.   naproxen 500 MG tablet Commonly known as:  NAPROSYN Take 1 tablet (500 mg total) by  mouth 2 (two) times daily with a meal.   oxybutynin 5 MG 24 hr tablet Commonly known as:  DITROPAN-XL TAKE 1 TABLET(5 MG) BY MOUTH AT BEDTIME   triamcinolone cream 0.5 % Commonly known as:  KENALOG Apply 1 application topically 2 (two) times daily.       Allergies:  Allergies  Allergen Reactions  . Shellfish Allergy Swelling    "swelling of lips and tongue"    Family History: Family History  Problem Relation Age of Onset  . Hyperlipidemia Mother   . Hypertension Mother     Social History:  reports that she has never smoked. She has never used smokeless tobacco. She reports that she drinks alcohol. She reports that she does not use drugs.  ROS:                                        Physical Exam: There were no vitals taken for this visit.  Constitutional: Well nourished. Alert and oriented, No acute distress. HEENT: [place] AT, moist mucus membranes. Trachea midline, no masses. Cardiovascular: No clubbing, cyanosis, or edema. Respiratory: Normal respiratory effort, no increased work of breathing. GI: Abdomen is soft, non tender, non distended, no abdominal masses. Liver and spleen not palpable.  No hernias appreciated.  Stool sample for occult testing is not indicated.   GU: No CVA tenderness.  No bladder fullness or masses.  Normal external genitalia, normal pubic hair distribution, no lesions.  Normal urethral meatus, no lesions, no prolapse, no discharge.   No urethral masses, tenderness and/or tenderness. No bladder fullness, tenderness or masses. Normal vagina mucosa, good estrogen effect, no discharge, no lesions, good pelvic support, no cystocele or rectocele noted.  No cervical motion tenderness.  Uterus is freely mobile and non-fixed.  No adnexal/parametria masses or tenderness noted.  Anus and perineum are without rashes or lesions.   *** Skin: No rashes, bruises or suspicious lesions. Lymph: No cervical or inguinal adenopathy. Neurologic:  Grossly intact, no focal deficits, moving all 4 extremities. Psychiatric: Normal mood and affect.  Laboratory Data: *** I have independently reviewed the labs.  Urinalysis ***  Pertinent Imaging: *** I have independently reviewed the films.    Assessment & Plan:  ***  Incontinence  - offered behavioral therapies  - bladder training  - bladder control strategies  - pelvic floor muscle training  - fluid management   - offered medical therapy with anticholinergic therapy or beta-3 adrenergic receptor agonist and the potential side effects of each therapy ***  - offered refer to gynecology for a pessary fitting ***  - offered an appointment with one of our surgeon for a possible pelvic sling procedure ***  - would like to try the beta-3 adrenergic receptor agonist (Myrbetriq).  Given Myrbetriq 25 mg samples, #28.  I have reviewed with the patient of the side effects of Myrbetriq, such as: elevation in BP, urinary retention and/or HA.  She will return in one month for PVR and symptom recheck.  ***  - would  like to try anticholinergic therapy.  Given Vesicare 5mg /10mg , Toviaz 4mg /8mg  samples, # 28.   Advised of the side effects, such as: Dry eyes, dry mouth, constipation, mental confusion and/or urinary retention. ***  - RTC in 3 weeks for PVR and symptom recheck ***     No Follow-up on file.  These notes generated with voice recognition software. I apologize for typographical errors.  Michiel Cowboy, PA-C  [facility] [address] [place], [place] [address] [phone]

## 2016-11-01 ENCOUNTER — Ambulatory Visit: Payer: Self-pay | Admitting: Urology

## 2016-11-29 ENCOUNTER — Ambulatory Visit: Payer: Self-pay | Admitting: Urology

## 2016-12-02 ENCOUNTER — Encounter: Payer: Self-pay | Admitting: Internal Medicine

## 2016-12-18 ENCOUNTER — Other Ambulatory Visit: Payer: Self-pay | Admitting: Internal Medicine

## 2016-12-18 DIAGNOSIS — Z30013 Encounter for initial prescription of injectable contraceptive: Secondary | ICD-10-CM

## 2016-12-27 ENCOUNTER — Ambulatory Visit: Payer: Self-pay

## 2016-12-30 ENCOUNTER — Ambulatory Visit (INDEPENDENT_AMBULATORY_CARE_PROVIDER_SITE_OTHER): Payer: 59

## 2016-12-30 DIAGNOSIS — Z309 Encounter for contraceptive management, unspecified: Secondary | ICD-10-CM | POA: Diagnosis not present

## 2016-12-30 MED ORDER — MEDROXYPROGESTERONE ACETATE 150 MG/ML IM SUSP
150.0000 mg | Freq: Once | INTRAMUSCULAR | Status: AC
  Administered 2016-12-30: 150 mg via INTRAMUSCULAR

## 2017-01-01 ENCOUNTER — Ambulatory Visit: Payer: Self-pay | Admitting: Internal Medicine

## 2017-03-10 ENCOUNTER — Other Ambulatory Visit: Payer: Self-pay | Admitting: Internal Medicine

## 2017-03-10 DIAGNOSIS — N3281 Overactive bladder: Secondary | ICD-10-CM

## 2017-03-10 NOTE — Telephone Encounter (Signed)
lvm to set up appt

## 2017-03-26 ENCOUNTER — Other Ambulatory Visit: Payer: Self-pay | Admitting: Internal Medicine

## 2017-03-26 DIAGNOSIS — Z30013 Encounter for initial prescription of injectable contraceptive: Secondary | ICD-10-CM

## 2017-03-31 ENCOUNTER — Ambulatory Visit (INDEPENDENT_AMBULATORY_CARE_PROVIDER_SITE_OTHER): Payer: No Typology Code available for payment source

## 2017-03-31 DIAGNOSIS — Z3042 Encounter for surveillance of injectable contraceptive: Secondary | ICD-10-CM | POA: Diagnosis not present

## 2017-03-31 DIAGNOSIS — Z30013 Encounter for initial prescription of injectable contraceptive: Secondary | ICD-10-CM

## 2017-03-31 MED ORDER — MEDROXYPROGESTERONE ACETATE 150 MG/ML IM SUSP
150.0000 mg | Freq: Once | INTRAMUSCULAR | Status: AC
  Administered 2017-03-31: 150 mg via INTRAMUSCULAR

## 2017-04-04 ENCOUNTER — Ambulatory Visit (INDEPENDENT_AMBULATORY_CARE_PROVIDER_SITE_OTHER): Payer: No Typology Code available for payment source | Admitting: Internal Medicine

## 2017-04-04 ENCOUNTER — Encounter: Payer: Self-pay | Admitting: Internal Medicine

## 2017-04-04 VITALS — BP 128/64 | HR 78 | Ht 61.0 in | Wt 208.0 lb

## 2017-04-04 DIAGNOSIS — Z9884 Bariatric surgery status: Secondary | ICD-10-CM

## 2017-04-04 DIAGNOSIS — Z789 Other specified health status: Secondary | ICD-10-CM | POA: Insufficient documentation

## 2017-04-04 DIAGNOSIS — Z23 Encounter for immunization: Secondary | ICD-10-CM | POA: Diagnosis not present

## 2017-04-04 DIAGNOSIS — Z0000 Encounter for general adult medical examination without abnormal findings: Secondary | ICD-10-CM

## 2017-04-04 DIAGNOSIS — Z114 Encounter for screening for human immunodeficiency virus [HIV]: Secondary | ICD-10-CM

## 2017-04-04 DIAGNOSIS — J4520 Mild intermittent asthma, uncomplicated: Secondary | ICD-10-CM

## 2017-04-04 HISTORY — DX: Other specified health status: Z78.9

## 2017-04-04 LAB — POCT URINALYSIS DIPSTICK
Bilirubin, UA: NEGATIVE
Blood, UA: NEGATIVE
Glucose, UA: NEGATIVE
Ketones, UA: NEGATIVE
Leukocytes, UA: NEGATIVE
Nitrite, UA: NEGATIVE
Protein, UA: NEGATIVE
Spec Grav, UA: 1.01 (ref 1.010–1.025)
Urobilinogen, UA: 0.2 E.U./dL
pH, UA: 6 (ref 5.0–8.0)

## 2017-04-04 MED ORDER — MONTELUKAST SODIUM 10 MG PO TABS: 10.0000 mg | ORAL_TABLET | Freq: Every day | ORAL | 3 refills | Status: DC

## 2017-04-04 NOTE — Progress Notes (Signed)
Date:  04/04/2017   Name:  Cassandra Barajas   DOB:  11/12/1990   MRN:  [id number]   Chief Complaint: Annual Exam (Flu SHot) Cassandra Barajas is a 26 y.o. female who presents today for her Complete Annual Exam. She feels fairly well. She reports exercising none but is planning to resume. She reports she is sleeping fairly well but only gets 4 hours per night.  She has GYN in [place] -  Pap and breast exam this past summer.   Asthma  She complains of cough, shortness of breath and wheezing. This is a recurrent problem. The problem occurs intermittently. Pertinent negatives include no chest pain, ear pain, fever, headaches or trouble swallowing. Her past medical history is significant for asthma.  Insomnia  Primary symptoms: sleep disturbance, premature morning awakening.  The current episode started more than one month. The problem occurs every several days. Prior diagnostic workup includes:  No prior workup.   Wt Readings from Last 3 Encounters:  04/04/17 208 lb (94.3 kg)  05/09/16 185 lb (83.9 kg)  03/29/16 189 lb (85.7 kg)      Review of Systems  Constitutional: Negative for chills, fatigue and fever.  HENT: Negative for ear pain and trouble swallowing.   Respiratory: Positive for cough, shortness of breath and wheezing.   Cardiovascular: Negative for chest pain and palpitations.  Gastrointestinal: Negative for abdominal pain and diarrhea.  Genitourinary: Positive for dyspareunia. Negative for difficulty urinating and vaginal bleeding (non menses on Depo provera).  Musculoskeletal: Positive for back pain. Negative for arthralgias and gait problem.  Neurological: Negative for dizziness and headaches.  Hematological: Negative for adenopathy.  Psychiatric/Behavioral: Positive for sleep disturbance. Negative for dysphoric mood and hallucinations. The patient has insomnia. The patient is not nervous/anxious.     Patient Active Problem List   Diagnosis Date Noted  . S/P bariatric surgery  10/09/2015  . Thoracic radiculopathy 10/09/2015  . OAB (overactive bladder) 04/20/2015  . Asthma, mild intermittent 08/04/2014  . NCGS (non-celiac gluten sensitivity) 08/04/2014    Prior to Admission medications   Medication Sig Start Date End Date Taking? Authorizing Provider  albuterol (PROVENTIL HFA;VENTOLIN HFA) 108 (90 BASE) MCG/ACT inhaler Inhale 2 puffs into the lungs 4 (four) times daily as needed. 10/04/14   Reubin MilanBerglund, Lileigh Fahringer H, MD       Beers, Charmayne Sheerharles M, PA-C  levocetirizine (XYZAL) 5 MG tablet Take 5 mg by mouth every evening.    [provider]  MedroxyPROGESTERone Acetate 150 MG/ML SUSY ADMINISTER 1 ML(150 MG) IN THE MUSCLE EVERY 3 MONTHS 03/26/17   Reubin MilanBerglund, Ennis Heavner H, MD  montelukast (SINGULAIR) 10 MG tablet Take 1 tablet by mouth daily. 03/17/16   [provider]       Evangeline DakinBeers, Charles M, PA-C  oxybutynin (DITROPAN-XL) 5 MG 24 hr tablet TAKE 1 TABLET(5 MG) BY MOUTH AT BEDTIME 03/10/17   Reubin MilanBerglund, Stokes Rattigan H, MD  triamcinolone cream (KENALOG) 0.5 % Apply 1 application topically 2 (two) times daily. 10/04/14   Reubin MilanBerglund, Fatema Rabe H, MD    Allergies  Allergen Reactions  . Shellfish Allergy Swelling    "swelling of lips and tongue"    Past Surgical History:  Procedure Laterality Date  . CHOLECYSTECTOMY  09/2008  . Gastric sleeve with HH repair  05/2015  . LAPAROSCOPIC GASTRIC SLEEVE RESECTION WITH HIATAL HERNIA REPAIR N/A 05/23/2015   Procedure: LAPAROSCOPIC GASTRIC SLEEVE RESECTION WITH HIATAL HERNIA REPAIR;  Surgeon: Everette RankMichael A Tyner, MD;  Location: [facility] ORS;  Service: General;  Laterality:  N/A;    Social History   Tobacco Use  . Smoking status: Never Smoker  . Smokeless tobacco: Never Used  Substance Use Topics  . Alcohol use: Yes    Alcohol/week: 0.0 oz    Comment: occ.  . Drug use: No     Medication list has been reviewed and updated.  PHQ 2/9 Scores 04/04/2017 10/09/2015  PHQ - 2 Score 0 0    Physical Exam  Constitutional: She is oriented to person,  place, and time. She appears well-developed and well-nourished. No distress.  HENT:  Head: Normocephalic and atraumatic.  Right Ear: Tympanic membrane and ear canal normal.  Left Ear: Tympanic membrane and ear canal normal.  Nose: Right sinus exhibits no maxillary sinus tenderness. Left sinus exhibits no maxillary sinus tenderness.  Mouth/Throat: Uvula is midline and oropharynx is clear and moist.  Eyes: Conjunctivae and EOM are normal. Right eye exhibits no discharge. Left eye exhibits no discharge. No scleral icterus.  Neck: Normal range of motion. Carotid bruit is not present. No erythema present. No thyromegaly present.  Cardiovascular: Normal rate, regular rhythm, normal heart sounds and normal pulses.  Pulmonary/Chest: Effort normal. No respiratory distress. She has no wheezes.  Abdominal: Soft. Bowel sounds are normal. There is no hepatosplenomegaly. There is no tenderness. There is no CVA tenderness.  Musculoskeletal: Normal range of motion.  Lymphadenopathy:    She has no cervical adenopathy.    She has no axillary adenopathy.  Neurological: She is alert and oriented to person, place, and time. She has normal reflexes. No cranial nerve deficit or sensory deficit.  Skin: Skin is warm, dry and intact. No rash noted.  Psychiatric: She has a normal mood and affect. Her speech is normal and behavior is normal. Thought content normal.  Nursing note and vitals reviewed.   BP 128/64   Pulse 78   Ht 5\' 1"  (1.549 m)   Wt 208 lb (94.3 kg)   SpO2 98%   BMI 39.30 kg/m   Assessment and Plan: 1. Annual physical exam Resume diet and exercise No medications for sleep at this time - Lipid panel - TSH - POCT urinalysis dipstick  2. Mild intermittent asthma without complication Continue inhalers and singulair - montelukast (SINGULAIR) 10 MG tablet; Take 1 tablet (10 mg total) by mouth daily.  Dispense: 90 tablet; Refill: 3  3. S/P bariatric surgery Resume supplements Check labs -  Magnesium - Vitamin B12  4. Uses Depo-Provera as primary birth control method - CBC with Differential/Platelet - Comprehensive metabolic panel  5. Encounter for screening for HIV - HIV antibody  6. Need for influenza vaccination - Flu Vaccine QUAD 36+ mos IM   Meds ordered this encounter  Medications  . montelukast (SINGULAIR) 10 MG tablet    Sig: Take 1 tablet (10 mg total) by mouth daily.    Dispense:  90 tablet    Refill:  3    Partially dictated using Animal nutritionistDragon software. Any errors are unintentional.  Bari EdwardLaura Koby Hartfield, MD [facility]  04/04/2017

## 2017-04-04 NOTE — Patient Instructions (Signed)

## 2017-04-05 LAB — CBC WITH DIFFERENTIAL/PLATELET
Basophils Absolute: 0 10*3/uL (ref 0.0–0.2)
Basos: 1 %
EOS (ABSOLUTE): 0.5 10*3/uL — ABNORMAL HIGH (ref 0.0–0.4)
Eos: 6 %
Hematocrit: 39.8 % (ref 34.0–46.6)
Hemoglobin: 13 g/dL (ref 11.1–15.9)
Immature Grans (Abs): 0 10*3/uL (ref 0.0–0.1)
Immature Granulocytes: 0 %
Lymphocytes Absolute: 2.9 10*3/uL (ref 0.7–3.1)
Lymphs: 33 %
MCH: 27.8 pg (ref 26.6–33.0)
MCHC: 32.7 g/dL (ref 31.5–35.7)
MCV: 85 fL (ref 79–97)
Monocytes Absolute: 0.7 10*3/uL (ref 0.1–0.9)
Monocytes: 8 %
Neutrophils Absolute: 4.7 10*3/uL (ref 1.4–7.0)
Neutrophils: 52 %
Platelets: 321 10*3/uL (ref 150–379)
RBC: 4.68 x10E6/uL (ref 3.77–5.28)
RDW: 13.7 % (ref 12.3–15.4)
WBC: 8.8 10*3/uL (ref 3.4–10.8)

## 2017-04-05 LAB — COMPREHENSIVE METABOLIC PANEL
ALT: 23 IU/L (ref 0–32)
AST: 19 IU/L (ref 0–40)
Albumin/Globulin Ratio: 1.8 (ref 1.2–2.2)
Albumin: 4.6 g/dL (ref 3.5–5.5)
Alkaline Phosphatase: 85 IU/L (ref 39–117)
BUN/Creatinine Ratio: 24 — ABNORMAL HIGH (ref 9–23)
BUN: 12 mg/dL (ref 6–20)
Bilirubin Total: 0.5 mg/dL (ref 0.0–1.2)
CO2: 21 mmol/L (ref 20–29)
Calcium: 9.5 mg/dL (ref 8.7–10.2)
Chloride: 103 mmol/L (ref 96–106)
Creatinine, Ser: 0.51 mg/dL — ABNORMAL LOW (ref 0.57–1.00)
GFR calc Af Amer: 155 mL/min/{1.73_m2} (ref >59)
GFR calc non Af Amer: 134 mL/min/{1.73_m2} (ref >59)
Globulin, Total: 2.5 g/dL (ref 1.5–4.5)
Glucose: 68 mg/dL (ref 65–99)
Potassium: 4.3 mmol/L (ref 3.5–5.2)
Sodium: 141 mmol/L (ref 134–144)
Total Protein: 7.1 g/dL (ref 6.0–8.5)

## 2017-04-05 LAB — LIPID PANEL
Chol/HDL Ratio: 3 ratio (ref 0.0–4.4)
Cholesterol, Total: 130 mg/dL (ref 100–199)
HDL: 43 mg/dL (ref >39)
LDL Calculated: 76 mg/dL (ref 0–99)
Triglycerides: 57 mg/dL (ref 0–149)
VLDL Cholesterol Cal: 11 mg/dL (ref 5–40)

## 2017-04-05 LAB — HIV ANTIBODY (ROUTINE TESTING W REFLEX): HIV Screen 4th Generation wRfx: NONREACTIVE

## 2017-04-05 LAB — MAGNESIUM: Magnesium: 2.2 mg/dL (ref 1.6–2.3)

## 2017-04-05 LAB — TSH: TSH: 1.68 u[IU]/mL (ref 0.450–4.500)

## 2017-04-05 LAB — VITAMIN B12: Vitamin B-12: 499 pg/mL (ref 232–1245)

## 2017-06-19 ENCOUNTER — Other Ambulatory Visit: Payer: Self-pay | Admitting: Internal Medicine

## 2017-06-19 DIAGNOSIS — Z30013 Encounter for initial prescription of injectable contraceptive: Secondary | ICD-10-CM

## 2017-06-23 ENCOUNTER — Ambulatory Visit: Payer: Self-pay

## 2017-06-24 ENCOUNTER — Ambulatory Visit (INDEPENDENT_AMBULATORY_CARE_PROVIDER_SITE_OTHER): Payer: No Typology Code available for payment source

## 2017-06-24 DIAGNOSIS — Z3042 Encounter for surveillance of injectable contraceptive: Secondary | ICD-10-CM | POA: Diagnosis not present

## 2017-06-24 DIAGNOSIS — Z30013 Encounter for initial prescription of injectable contraceptive: Secondary | ICD-10-CM

## 2017-06-24 MED ORDER — MEDROXYPROGESTERONE ACETATE 150 MG/ML IM SUSP: 150.0000 mg | Freq: Once | INTRAMUSCULAR | Status: DC

## 2017-06-24 MED ORDER — MEDROXYPROGESTERONE ACETATE 150 MG/ML IM SUSP
150.0000 mg | Freq: Once | INTRAMUSCULAR | Status: AC
  Administered 2017-06-24: 150 mg via INTRAMUSCULAR

## 2017-09-10 ENCOUNTER — Other Ambulatory Visit: Payer: Self-pay | Admitting: Internal Medicine

## 2017-09-10 DIAGNOSIS — Z30013 Encounter for initial prescription of injectable contraceptive: Secondary | ICD-10-CM

## 2017-09-11 ENCOUNTER — Ambulatory Visit (INDEPENDENT_AMBULATORY_CARE_PROVIDER_SITE_OTHER): Payer: No Typology Code available for payment source

## 2017-09-11 DIAGNOSIS — Z3042 Encounter for surveillance of injectable contraceptive: Secondary | ICD-10-CM

## 2017-09-11 MED ORDER — MEDROXYPROGESTERONE ACETATE 150 MG/ML IM SUSP
150.0000 mg | Freq: Once | INTRAMUSCULAR | Status: AC
  Administered 2017-09-11: 150 mg via INTRAMUSCULAR

## 2017-09-11 NOTE — Progress Notes (Signed)
Gave depo- pt supplied

## 2017-11-02 ENCOUNTER — Other Ambulatory Visit: Payer: Self-pay | Admitting: Internal Medicine

## 2017-11-02 DIAGNOSIS — N3281 Overactive bladder: Secondary | ICD-10-CM

## 2017-11-27 ENCOUNTER — Ambulatory Visit: Payer: Self-pay

## 2018-01-27 ENCOUNTER — Other Ambulatory Visit: Payer: Self-pay | Admitting: Internal Medicine

## 2018-01-27 DIAGNOSIS — Z30013 Encounter for initial prescription of injectable contraceptive: Secondary | ICD-10-CM

## 2018-02-02 ENCOUNTER — Ambulatory Visit (INDEPENDENT_AMBULATORY_CARE_PROVIDER_SITE_OTHER): Payer: Self-pay | Admitting: Internal Medicine

## 2018-02-02 ENCOUNTER — Encounter: Payer: Self-pay | Admitting: Internal Medicine

## 2018-02-02 VITALS — BP 128/74 | HR 95 | Ht 61.0 in | Wt 235.0 lb

## 2018-02-02 DIAGNOSIS — Z309 Encounter for contraceptive management, unspecified: Secondary | ICD-10-CM

## 2018-02-02 DIAGNOSIS — Z789 Other specified health status: Secondary | ICD-10-CM

## 2018-02-02 DIAGNOSIS — J4520 Mild intermittent asthma, uncomplicated: Secondary | ICD-10-CM | POA: Insufficient documentation

## 2018-02-02 DIAGNOSIS — Z9884 Bariatric surgery status: Secondary | ICD-10-CM

## 2018-02-02 LAB — POCT URINE PREGNANCY: Preg Test, Ur: NEGATIVE

## 2018-02-02 MED ORDER — ALBUTEROL SULFATE HFA 108 (90 BASE) MCG/ACT IN AERS: 2.0000 | INHALATION_SPRAY | Freq: Four times a day (QID) | RESPIRATORY_TRACT | 5 refills | Status: DC | PRN

## 2018-02-02 MED ORDER — MEDROXYPROGESTERONE ACETATE 150 MG/ML IM SUSY
150.0000 mg | PREFILLED_SYRINGE | Freq: Once | INTRAMUSCULAR | Status: AC
  Administered 2018-02-02: 150 mg via INTRAMUSCULAR

## 2018-02-02 NOTE — Progress Notes (Signed)
Date:  02/02/2018   Name:  Cassandra Barajas   DOB:  May 08, 1990   MRN:  [id number]   Chief Complaint: Contraception (Needs preg test and then depo shot given. )  Asthma  She complains of wheezing. There is no shortness of breath. This is a recurrent problem. The problem occurs rarely. Pertinent negatives include no chest pain, fever or headaches. Her symptoms are alleviated by beta-agonist. Her past medical history is significant for asthma.    Contraception -  Missed her last Depo Provera injection.  She has not had a period but really wants to continue, despite no insurance.  She is hoping to get coverage through the marketplace in the near future.  Review of Systems  Constitutional: Positive for unexpected weight change. Negative for chills, fatigue and fever.  Respiratory: Positive for wheezing. Negative for chest tightness and shortness of breath.   Cardiovascular: Negative for chest pain and palpitations.  Genitourinary: Negative for menstrual problem and vaginal discharge.  Musculoskeletal: Negative for arthralgias.  Allergic/Immunologic: Positive for environmental allergies.  Neurological: Negative for dizziness and headaches.  Psychiatric/Behavioral: Negative for dysphoric mood and sleep disturbance.    Patient Active Problem List   Diagnosis Date Noted  . Mild intermittent asthma without complication 02/02/2018  . Uses Depo-Provera as primary birth control method 04/04/2017  . S/P bariatric surgery 10/09/2015  . Thoracic radiculopathy 10/09/2015  . OAB (overactive bladder) 04/20/2015  . NCGS (non-celiac gluten sensitivity) 08/04/2014    Allergies  Allergen Reactions  . Shellfish Allergy Swelling    "swelling of lips and tongue"    Past Surgical History:  Procedure Laterality Date  . CHOLECYSTECTOMY  09/2008  . Gastric sleeve with HH repair  05/2015  . LAPAROSCOPIC GASTRIC SLEEVE RESECTION WITH HIATAL HERNIA REPAIR N/A 05/23/2015   Procedure: LAPAROSCOPIC GASTRIC  SLEEVE RESECTION WITH HIATAL HERNIA REPAIR;  Surgeon: Everette Rank, MD;  Location: [facility] ORS;  Service: General;  Laterality: N/A;    Social History   Tobacco Use  . Smoking status: Never Smoker  . Smokeless tobacco: Never Used  Substance Use Topics  . Alcohol use: Yes    Alcohol/week: 0.0 standard drinks    Comment: occ.  . Drug use: No     Medication list has been reviewed and updated.  Current Meds  Medication Sig  . albuterol (PROVENTIL HFA;VENTOLIN HFA) 108 (90 BASE) MCG/ACT inhaler Inhale 2 puffs into the lungs 4 (four) times daily as needed.  Marland Kitchen levocetirizine (XYZAL) 5 MG tablet Take 5 mg by mouth every evening.  . medroxyPROGESTERone Acetate 150 MG/ML SUSY ADMINISTER 1 ML(150 MG) IN THE MUSCLE EVERY 3 MONTHS  . montelukast (SINGULAIR) 10 MG tablet Take 1 tablet (10 mg total) by mouth daily.  Marland Kitchen oxybutynin (DITROPAN-XL) 5 MG 24 hr tablet TAKE 1 TABLET(5 MG) BY MOUTH AT BEDTIME  . triamcinolone cream (KENALOG) 0.5 % Apply 1 application topically 2 (two) times daily.    PHQ 2/9 Scores 04/04/2017 10/09/2015  PHQ - 2 Score 0 0    Physical Exam  Constitutional: She is oriented to person, place, and time. She appears well-developed. No distress.  HENT:  Head: Normocephalic and atraumatic.  Neck: Normal range of motion. Neck supple.  Cardiovascular: Normal rate, regular rhythm and normal heart sounds.  Pulmonary/Chest: Effort normal and breath sounds normal. No respiratory distress.  Musculoskeletal: Normal range of motion. She exhibits no edema.  Neurological: She is alert and oriented to person, place, and time.  Skin: Skin is warm and dry.  No rash noted.  Psychiatric: She has a normal mood and affect. Her behavior is normal. Thought content normal.  Nursing note and vitals reviewed.   BP 128/74 (BP Location: Right Arm, Patient Position: Sitting, Cuff Size: Normal)   Pulse 95   Ht 5\' 1"  (1.549 m)   Wt 235 lb (106.6 kg)   SpO2 95%   BMI 44.40 kg/m   Assessment  and Plan: 1. Mild intermittent asthma without complication - albuterol (PROVENTIL HFA;VENTOLIN HFA) 108 (90 Base) MCG/ACT inhaler; Inhale 2 puffs into the lungs 4 (four) times daily as needed.  Dispense: 18 g; Refill: 5  2. Uses Depo-Provera as primary birth control method Urine pregnancy negative Depo Provera given Follow up 3 months  3. S/P bariatric surgery Resume diet and exercise   Partially dictated using Dragon software. Any errors are unintentional.  Bari Edward, MD [facility]  02/02/2018

## 2018-02-02 NOTE — Patient Instructions (Signed)
Next Depo due Jan 6-20, 2020

## 2018-04-18 ENCOUNTER — Other Ambulatory Visit: Payer: Self-pay | Admitting: Internal Medicine

## 2018-04-18 DIAGNOSIS — Z30013 Encounter for initial prescription of injectable contraceptive: Secondary | ICD-10-CM

## 2018-04-22 ENCOUNTER — Other Ambulatory Visit: Payer: Self-pay

## 2018-04-22 DIAGNOSIS — Z30013 Encounter for initial prescription of injectable contraceptive: Secondary | ICD-10-CM

## 2018-04-22 MED ORDER — MEDROXYPROGESTERONE ACETATE 150 MG/ML IM SUSY: 1.0000 mL | PREFILLED_SYRINGE | INTRAMUSCULAR | 0 refills | Status: DC

## 2018-04-22 NOTE — Progress Notes (Signed)
Patient called needing Depo injection sent to diff pharmacy because it will be cheaper for her and she does not have insurance currently.   Sent to [facility] in [place].

## 2018-04-23 ENCOUNTER — Ambulatory Visit (INDEPENDENT_AMBULATORY_CARE_PROVIDER_SITE_OTHER): Payer: Self-pay

## 2018-04-23 DIAGNOSIS — Z3049 Encounter for surveillance of other contraceptives: Secondary | ICD-10-CM

## 2018-04-23 MED ORDER — MEDROXYPROGESTERONE ACETATE 150 MG/ML IM SUSP
150.0000 mg | Freq: Once | INTRAMUSCULAR | Status: AC
  Administered 2018-04-23: 150 mg via INTRAMUSCULAR

## 2018-04-23 NOTE — Progress Notes (Signed)
Patient came in today to received DEPO injection. Was given in ROOM 1. Patient tolerated well. Was given dates to return for her next DEPO shot. Told her to stop at front and schedule next DEPO so she will not forget or lose dates.   Patient verbalized understanding.

## 2018-07-13 ENCOUNTER — Other Ambulatory Visit: Payer: Self-pay | Admitting: Internal Medicine

## 2018-07-13 DIAGNOSIS — Z30013 Encounter for initial prescription of injectable contraceptive: Secondary | ICD-10-CM

## 2018-07-16 ENCOUNTER — Ambulatory Visit (INDEPENDENT_AMBULATORY_CARE_PROVIDER_SITE_OTHER): Payer: Self-pay

## 2018-07-16 ENCOUNTER — Other Ambulatory Visit: Payer: Self-pay

## 2018-07-16 DIAGNOSIS — Z3042 Encounter for surveillance of injectable contraceptive: Secondary | ICD-10-CM

## 2018-07-16 MED ORDER — MEDROXYPROGESTERONE ACETATE 150 MG/ML IM SUSP: 150.0000 mg | Freq: Once | INTRAMUSCULAR | 0 refills | Status: DC

## 2018-07-16 MED ORDER — MEDROXYPROGESTERONE ACETATE 150 MG/ML IM SUSY
150.0000 mg | PREFILLED_SYRINGE | INTRAMUSCULAR | Status: AC
  Administered 2018-07-16: 150 mg via INTRAMUSCULAR

## 2018-10-08 ENCOUNTER — Ambulatory Visit (INDEPENDENT_AMBULATORY_CARE_PROVIDER_SITE_OTHER): Payer: Self-pay

## 2018-10-08 ENCOUNTER — Other Ambulatory Visit: Payer: Self-pay

## 2018-10-08 DIAGNOSIS — Z3042 Encounter for surveillance of injectable contraceptive: Secondary | ICD-10-CM

## 2018-10-08 MED ORDER — MEDROXYPROGESTERONE ACETATE 150 MG/ML IM SUSP: 150.0000 mg | Freq: Once | INTRAMUSCULAR | 0 refills | Status: DC

## 2018-10-08 MED ORDER — MEDROXYPROGESTERONE ACETATE 150 MG/ML IM SUSP
150.0000 mg | Freq: Once | INTRAMUSCULAR | Status: AC
  Administered 2018-10-08: 150 mg via INTRAMUSCULAR

## 2018-10-19 ENCOUNTER — Telehealth: Payer: Self-pay

## 2018-10-19 NOTE — Telephone Encounter (Signed)
Advised. Thank you 

## 2018-10-19 NOTE — Telephone Encounter (Signed)
Patient called leaving VM stating she has had a fever for several days, and is "sick". She went to have self swab [facility] Covid test done and came back negative but she doesn't feel she swabbed herself correctly because she is still having sxs.  Please call the patient to schedule a telephone visit for 1120 AM this morning. Tell her we cannot send for testing without a visit on the phone first. Ask her to take her tempeture and blood pressure if she is able before the call visit.  Thank you.

## 2018-10-19 NOTE — Telephone Encounter (Signed)
Cassandra Barajas does not need appointment. Patient stated that she had a televisit from a provide  else where and they told her if she wanted to be tested for covid she would contact a center to be tested.

## 2018-10-21 ENCOUNTER — Telehealth: Payer: 59 | Admitting: Family

## 2018-10-21 DIAGNOSIS — Z20822 Contact with and (suspected) exposure to covid-19: Secondary | ICD-10-CM

## 2018-10-21 MED ORDER — BENZONATATE 100 MG PO CAPS: 100.0000 mg | ORAL_CAPSULE | Freq: Three times a day (TID) | ORAL | 0 refills | Status: DC | PRN

## 2018-10-21 NOTE — Progress Notes (Signed)
E-Visit for Corona Virus Screening   Your current symptoms could be consistent with the coronavirus.  I have sent your note to our Community Testing site. They should reach out to you in the next 24 hours to set up a time for testing if you qualify.  Approximately 5 minutes was spent documenting and reviewing patient's chart.    COVID-19 is a respiratory illness with symptoms that are similar to the flu. Symptoms are typically mild to moderate, but there have been cases of severe illness and death due to the virus. The following symptoms may appear 2-14 days after exposure: . Fever . Cough . Shortness of breath or difficulty breathing . Chills . Repeated shaking with chills . Muscle pain . Headache . Sore throat . New loss of taste or smell . Fatigue . Congestion or runny nose . Nausea or vomiting . Diarrhea  It is vitally important that if you feel that you have an infection such as this virus or any other virus that you stay home and away from places where you may spread it to others.  You should self-quarantine for 14 days if you have symptoms that could potentially be coronavirus or have been in close contact a with a person diagnosed with COVID-19 within the last 2 weeks. You should avoid contact with people age 65 and older.   You should wear a mask or cloth face covering over your nose and mouth if you must be around other people or animals, including pets (even at home). Try to stay at least 6 feet away from other people. This will protect the people around you.  You can use medication such as A prescription cough medication called Tessalon Perles 100 mg. You may take 1-2 capsules every 8 hours as needed for cough  You may also take acetaminophen (Tylenol) as needed for fever.   Reduce your risk of any infection by using the same precautions used for avoiding the common cold or flu:  . Wash your hands often with soap and warm water for at least 20 seconds.  If soap and water  are not readily available, use an alcohol-based hand sanitizer with at least 60% alcohol.  . If coughing or sneezing, cover your mouth and nose by coughing or sneezing into the elbow areas of your shirt or coat, into a tissue or into your sleeve (not your hands). . Avoid shaking hands with others and consider head nods or verbal greetings only. . Avoid touching your eyes, nose, or mouth with unwashed hands.  . Avoid close contact with people who are sick. . Avoid places or events with large numbers of people in one location, like concerts or sporting events. . Carefully consider travel plans you have or are making. . If you are planning any travel outside or inside the [place], visit the CDC's Travelers' Health webpage for the latest health notices. . If you have some symptoms but not all symptoms, continue to monitor at home and seek medical attention if your symptoms worsen. . If you are having a medical emergency, call 911.  HOME CARE . Only take medications as instructed by your medical team. . Drink plenty of fluids and get plenty of rest. . A steam or ultrasonic humidifier can help if you have congestion.   GET HELP RIGHT AWAY IF YOU HAVE EMERGENCY WARNING SIGNS** FOR COVID-19. If you or someone is showing any of these signs seek emergency medical care immediately. Call 911 or proceed to your closest emergency   facility if: . You develop worsening high fever. . Trouble breathing . Bluish lips or face . Persistent pain or pressure in the chest . New confusion . Inability to wake or stay awake . You cough up blood. . Your symptoms become more severe  **This list is not all possible symptoms. Contact your medical provider for any symptoms that are sever or concerning to you.   MAKE SURE YOU   Understand these instructions.  Will watch your condition.  Will get help right away if you are not doing well or get worse.  Your e-visit answers were reviewed by a board certified advanced  clinical practitioner to complete your personal care plan.  Depending on the condition, your plan could have included both over the counter or prescription medications.  If there is a problem please reply once you have received a response from your provider.  Your safety is important to us.  If you have drug allergies check your prescription carefully.    You can use MyChart to ask questions about today's visit, request a non-urgent call back, or ask for a work or school excuse for 24 hours related to this e-Visit. If it has been greater than 24 hours you will need to follow up with your provider, or enter a new e-Visit to address those concerns. You will get an e-mail in the next two days asking about your experience.  I hope that your e-visit has been valuable and will speed your recovery. Thank you for using e-visits. 

## 2018-10-22 ENCOUNTER — Other Ambulatory Visit: Payer: Self-pay | Admitting: *Deleted

## 2018-10-22 DIAGNOSIS — Z20822 Contact with and (suspected) exposure to covid-19: Secondary | ICD-10-CM

## 2018-10-22 NOTE — Progress Notes (Signed)
[id number] 

## 2018-10-27 LAB — NOVEL CORONAVIRUS, NAA: SARS-CoV-2, NAA: NOT DETECTED

## 2018-12-31 ENCOUNTER — Ambulatory Visit (INDEPENDENT_AMBULATORY_CARE_PROVIDER_SITE_OTHER): Payer: Self-pay

## 2018-12-31 ENCOUNTER — Other Ambulatory Visit: Payer: Self-pay

## 2018-12-31 DIAGNOSIS — Z789 Other specified health status: Secondary | ICD-10-CM

## 2018-12-31 MED ORDER — MEDROXYPROGESTERONE ACETATE 150 MG/ML IM SUSY
150.0000 mg | PREFILLED_SYRINGE | Freq: Once | INTRAMUSCULAR | Status: AC
  Administered 2018-12-31: 150 mg via INTRAMUSCULAR

## 2018-12-31 NOTE — Progress Notes (Signed)
Depo given

## 2019-01-07 ENCOUNTER — Other Ambulatory Visit: Payer: Self-pay

## 2019-01-07 DIAGNOSIS — Z20822 Contact with and (suspected) exposure to covid-19: Secondary | ICD-10-CM

## 2019-01-08 LAB — NOVEL CORONAVIRUS, NAA: SARS-CoV-2, NAA: NOT DETECTED

## 2019-03-14 ENCOUNTER — Other Ambulatory Visit: Payer: Self-pay | Admitting: Internal Medicine

## 2019-03-18 ENCOUNTER — Other Ambulatory Visit: Payer: Self-pay

## 2019-03-18 ENCOUNTER — Ambulatory Visit (INDEPENDENT_AMBULATORY_CARE_PROVIDER_SITE_OTHER): Payer: Self-pay

## 2019-03-18 DIAGNOSIS — Z789 Other specified health status: Secondary | ICD-10-CM

## 2019-03-18 MED ORDER — MEDROXYPROGESTERONE ACETATE 150 MG/ML IM SUSY
150.0000 mg | PREFILLED_SYRINGE | Freq: Once | INTRAMUSCULAR | Status: AC
  Administered 2019-03-18: 150 mg via INTRAMUSCULAR

## 2019-05-17 ENCOUNTER — Other Ambulatory Visit: Payer: Self-pay

## 2019-05-17 DIAGNOSIS — J4520 Mild intermittent asthma, uncomplicated: Secondary | ICD-10-CM

## 2019-05-17 MED ORDER — ALBUTEROL SULFATE HFA 108 (90 BASE) MCG/ACT IN AERS: 2.0000 | INHALATION_SPRAY | Freq: Four times a day (QID) | RESPIRATORY_TRACT | 5 refills | Status: DC | PRN

## 2019-05-17 MED ORDER — IPRATROPIUM BROMIDE 0.02 % IN SOLN
500.00 | RESPIRATORY_TRACT | Status: DC
Start: 2019-05-17 — End: 2019-05-17

## 2019-05-31 ENCOUNTER — Other Ambulatory Visit: Payer: Self-pay | Admitting: Internal Medicine

## 2019-06-03 ENCOUNTER — Ambulatory Visit: Payer: Self-pay

## 2019-06-10 ENCOUNTER — Ambulatory Visit (INDEPENDENT_AMBULATORY_CARE_PROVIDER_SITE_OTHER): Payer: Self-pay

## 2019-06-10 ENCOUNTER — Other Ambulatory Visit: Payer: Self-pay

## 2019-06-10 DIAGNOSIS — Z789 Other specified health status: Secondary | ICD-10-CM

## 2019-06-10 MED ORDER — MEDROXYPROGESTERONE ACETATE 150 MG/ML IM SUSP
150.0000 mg | Freq: Once | INTRAMUSCULAR | Status: AC
  Administered 2019-06-10: 150 mg via INTRAMUSCULAR

## 2019-09-02 ENCOUNTER — Ambulatory Visit: Payer: Self-pay

## 2019-09-06 ENCOUNTER — Telehealth: Payer: Self-pay | Admitting: Internal Medicine

## 2019-09-06 NOTE — Telephone Encounter (Signed)
Note: Refilled non assigned Rx- due to appointment scheduled- didn't realize need to be forwarded to PCP- apologies

## 2019-09-06 NOTE — Telephone Encounter (Signed)
Appt 5/27- RF for appointment

## 2019-09-09 ENCOUNTER — Other Ambulatory Visit: Payer: Self-pay

## 2019-09-09 ENCOUNTER — Ambulatory Visit (INDEPENDENT_AMBULATORY_CARE_PROVIDER_SITE_OTHER): Payer: Self-pay | Admitting: Internal Medicine

## 2019-09-09 DIAGNOSIS — Z789 Other specified health status: Secondary | ICD-10-CM

## 2019-09-09 MED ORDER — MEDROXYPROGESTERONE ACETATE 150 MG/ML IM SUSP
150.0000 mg | Freq: Once | INTRAMUSCULAR | Status: AC
  Administered 2019-09-09: 150 mg via INTRAMUSCULAR

## 2019-11-28 ENCOUNTER — Other Ambulatory Visit: Payer: Self-pay | Admitting: Internal Medicine

## 2019-11-28 NOTE — Telephone Encounter (Signed)
Requested medications are due for refill today? Yes  Requested medications are on active medication list?  Yes  Last Refill:   09/06/2019  # 1 ml with no refills  Future visit scheduled?  No   Notes to Clinic:  This medication is not assigned to a protocol.

## 2019-12-01 ENCOUNTER — Ambulatory Visit (INDEPENDENT_AMBULATORY_CARE_PROVIDER_SITE_OTHER): Payer: Self-pay

## 2019-12-01 ENCOUNTER — Other Ambulatory Visit: Payer: Self-pay

## 2019-12-01 DIAGNOSIS — Z789 Other specified health status: Secondary | ICD-10-CM

## 2019-12-01 MED ORDER — MEDROXYPROGESTERONE ACETATE 150 MG/ML IM SUSP
150.0000 mg | Freq: Once | INTRAMUSCULAR | Status: AC
  Administered 2019-12-01: 150 mg via INTRAMUSCULAR

## 2019-12-02 ENCOUNTER — Ambulatory Visit: Payer: Self-pay

## 2020-02-16 ENCOUNTER — Ambulatory Visit: Payer: Self-pay

## 2020-02-17 ENCOUNTER — Ambulatory Visit: Payer: Self-pay

## 2020-02-19 ENCOUNTER — Other Ambulatory Visit: Payer: Self-pay | Admitting: Internal Medicine

## 2020-02-19 NOTE — Telephone Encounter (Signed)
Requested medications are due for refill today yes  Requested medications are on the active medication list yes  Last refill 8/16  Last visit 08/2019  Future visit scheduled no  Notes to clinic This med does not have a protocol to follow for approval, please assess.

## 2020-02-24 ENCOUNTER — Ambulatory Visit (INDEPENDENT_AMBULATORY_CARE_PROVIDER_SITE_OTHER): Payer: Self-pay

## 2020-02-24 ENCOUNTER — Other Ambulatory Visit: Payer: Self-pay

## 2020-02-24 DIAGNOSIS — Z789 Other specified health status: Secondary | ICD-10-CM

## 2020-02-24 MED ORDER — MEDROXYPROGESTERONE ACETATE 150 MG/ML IM SUSY
150.0000 mg | PREFILLED_SYRINGE | INTRAMUSCULAR | Status: AC
  Administered 2020-02-24: 150 mg via INTRAMUSCULAR

## 2020-02-24 MED ORDER — MEDROXYPROGESTERONE ACETATE 150 MG/ML IM SUSP: 150.0000 mg | Freq: Once | INTRAMUSCULAR | 0 refills | Status: DC

## 2020-04-22 ENCOUNTER — Other Ambulatory Visit: Payer: BLUE CROSS/BLUE SHIELD

## 2020-04-22 DIAGNOSIS — Z20822 Contact with and (suspected) exposure to covid-19: Secondary | ICD-10-CM

## 2020-04-24 LAB — SARS-COV-2, NAA 2 DAY TAT

## 2020-04-24 LAB — NOVEL CORONAVIRUS, NAA: SARS-CoV-2, NAA: NOT DETECTED

## 2020-05-13 ENCOUNTER — Other Ambulatory Visit: Payer: Self-pay | Admitting: Internal Medicine

## 2020-05-13 NOTE — Telephone Encounter (Signed)
Requested medication (s) are due for refill today: yes  Requested medication (s) are on the active medication list: expired 02/24/20  Last refill:  04/26/19  Future visit scheduled: no  Notes to clinic:  med not assigned to a protocol   Requested Prescriptions  Pending Prescriptions Disp Refills   medroxyPROGESTERone Acetate 150 MG/ML SUSY [Pharmacy Med Name: MEDROXYPROGESTERONE 150MG /ML PF SYR] 1 mL     Sig: ADMINISTER 1 ML(150 MG) IN THE MUSCLE EVERY 3 MONTHS      Off-Protocol Failed - 05/13/2020  3:20 AM      Failed - Medication not assigned to a protocol, review manually.      Passed - Valid encounter within last 12 months    Recent Outpatient Visits           8 months ago Uses Depo-Provera as primary birth control method   [facility], MD   2 years ago Mild intermittent asthma without complication   [facility], MD   3 years ago Annual physical exam   [facility], MD   4 years ago Encounter for other contraceptive management   [facility], MD   4 years ago Annual physical exam   [facility], MD

## 2020-05-18 ENCOUNTER — Ambulatory Visit: Payer: Self-pay

## 2020-05-19 ENCOUNTER — Ambulatory Visit (INDEPENDENT_AMBULATORY_CARE_PROVIDER_SITE_OTHER): Payer: Self-pay

## 2020-05-19 ENCOUNTER — Other Ambulatory Visit: Payer: Self-pay

## 2020-05-19 DIAGNOSIS — Z789 Other specified health status: Secondary | ICD-10-CM

## 2020-05-19 MED ORDER — MEDROXYPROGESTERONE ACETATE 150 MG/ML IM SUSP
150.0000 mg | Freq: Once | INTRAMUSCULAR | Status: AC
  Administered 2020-05-19: 150 mg via INTRAMUSCULAR

## 2020-08-10 ENCOUNTER — Ambulatory Visit: Payer: Self-pay

## 2020-09-07 ENCOUNTER — Encounter: Payer: Self-pay | Admitting: Internal Medicine

## 2020-09-07 NOTE — Progress Notes (Deleted)
Date:  09/07/2020   Name:  Cassandra Barajas   DOB:  1990/07/09   MRN:  [id number]   Chief Complaint: No chief complaint on file.  Cassandra Barajas is a 30 y.o. female who presents today for her Complete Annual Exam. She feels {DESC; WELL/FAIRLY WELL/POORLY:18703}. She reports exercising ***. She reports she is sleeping {DESC; WELL/FAIRLY WELL/POORLY:18703}. Breast complaints ***.  Pap smear: 09/2014 Contraception: Depo Provera; last inj 05/19/20  Immunization History  Administered Date(s) Administered  . Hepatitis B, ped/adol 09/27/1992, 11/15/1992, 11/13/1993  . Influenza,inj,Quad PF,6+ Mos 03/29/2016, 04/04/2017, 05/14/2019  . MMR 11/14/1995, 12/18/1996  . PFIZER(Purple Top)SARS-COV-2 Vaccination 06/05/2019, 06/26/2019, 02/20/2020  . Pneumococcal Polysaccharide-23 05/24/2015  . Tdap 09/26/2007    HPI  Lab Results  Component Value Date   CREATININE 0.51 (L) 04/04/2017   BUN 12 04/04/2017   NA 141 04/04/2017   K 4.3 04/04/2017   CL 103 04/04/2017   CO2 21 04/04/2017   Lab Results  Component Value Date   CHOL 130 04/04/2017   HDL 43 04/04/2017   LDLCALC 76 04/04/2017   TRIG 57 04/04/2017   CHOLHDL 3.0 04/04/2017   Lab Results  Component Value Date   TSH 1.680 04/04/2017   No results found for: HGBA1C Lab Results  Component Value Date   WBC 8.8 04/04/2017   HGB 13.0 04/04/2017   HCT 39.8 04/04/2017   MCV 85 04/04/2017   PLT 321 04/04/2017   Lab Results  Component Value Date   ALT 23 04/04/2017   AST 19 04/04/2017   ALKPHOS 85 04/04/2017   BILITOT 0.5 04/04/2017     Review of Systems  Constitutional: Negative for chills, fatigue and fever.  HENT: Negative for congestion, hearing loss, tinnitus, trouble swallowing and voice change.   Eyes: Negative for visual disturbance.  Respiratory: Negative for cough, chest tightness, shortness of breath and wheezing.   Cardiovascular: Negative for chest pain, palpitations and leg swelling.  Gastrointestinal: Negative  for abdominal pain, constipation, diarrhea and vomiting.  Endocrine: Negative for polydipsia and polyuria.  Genitourinary: Negative for dysuria, frequency, genital sores, vaginal bleeding and vaginal discharge.  Musculoskeletal: Negative for arthralgias, gait problem and joint swelling.  Skin: Negative for color change and rash.  Neurological: Negative for dizziness, tremors, light-headedness and headaches.  Hematological: Negative for adenopathy. Does not bruise/bleed easily.  Psychiatric/Behavioral: Negative for dysphoric mood and sleep disturbance. The patient is not nervous/anxious.     Patient Active Problem List   Diagnosis Date Noted  . Mild intermittent asthma without complication 14/97/0263  . Uses Depo-Provera as primary birth control method 04/04/2017  . S/P bariatric surgery 10/09/2015  . Thoracic radiculopathy 10/09/2015  . OAB (overactive bladder) 04/20/2015  . NCGS (non-celiac gluten sensitivity) 08/04/2014    Allergies  Allergen Reactions  . Shellfish Allergy Swelling    "swelling of lips and tongue"    Past Surgical History:  Procedure Laterality Date  . CHOLECYSTECTOMY  09/2008  . Gastric sleeve with HH repair  05/2015  . LAPAROSCOPIC GASTRIC SLEEVE RESECTION WITH HIATAL HERNIA REPAIR N/A 05/23/2015   Procedure: LAPAROSCOPIC GASTRIC SLEEVE RESECTION WITH HIATAL HERNIA REPAIR;  Surgeon: Ladora Daniel, MD;  Location: [facility] ORS;  Service: General;  Laterality: N/A;    Social History   Tobacco Use  . Smoking status: Never Smoker  . Smokeless tobacco: Never Used  Substance Use Topics  . Alcohol use: Yes    Alcohol/week: 0.0 standard drinks    Comment: occ.  . Drug use: No  Medication list has been reviewed and updated.  No outpatient medications have been marked as taking for the 09/07/20 encounter (Appointment) with Glean Hess, MD.    [facility] 2/9 Scores 04/04/2017 10/09/2015  PHQ - 2 Score 0 0    No flowsheet data found.  BP Readings from Last  3 Encounters:  02/02/18 128/74  04/04/17 128/64  05/09/16 125/68    Physical Exam Vitals and nursing note reviewed.  Constitutional:      General: She is not in acute distress.    Appearance: She is well-developed.  HENT:     Head: Normocephalic and atraumatic.     Right Ear: Tympanic membrane and ear canal normal.     Left Ear: Tympanic membrane and ear canal normal.     Nose:     Right Sinus: No maxillary sinus tenderness.     Left Sinus: No maxillary sinus tenderness.  Eyes:     General: No scleral icterus.       Right eye: No discharge.        Left eye: No discharge.     Conjunctiva/sclera: Conjunctivae normal.  Neck:     Thyroid: No thyromegaly.     Vascular: No carotid bruit.  Cardiovascular:     Rate and Rhythm: Normal rate and regular rhythm.     Pulses: Normal pulses.     Heart sounds: Normal heart sounds.  Pulmonary:     Effort: Pulmonary effort is normal. No respiratory distress.     Breath sounds: No wheezing.  Chest:  Breasts:     Right: No mass, nipple discharge, skin change or tenderness.     Left: No mass, nipple discharge, skin change or tenderness.    Abdominal:     General: Bowel sounds are normal.     Palpations: Abdomen is soft.     Tenderness: There is no abdominal tenderness.  Musculoskeletal:     Cervical back: Normal range of motion. No erythema.     Right lower leg: No edema.     Left lower leg: No edema.  Lymphadenopathy:     Cervical: No cervical adenopathy.  Skin:    General: Skin is warm and dry.     Findings: No rash.  Neurological:     Mental Status: She is alert and oriented to person, place, and time.     Cranial Nerves: No cranial nerve deficit.     Sensory: No sensory deficit.     Deep Tendon Reflexes: Reflexes are normal and symmetric.  Psychiatric:        Attention and Perception: Attention normal.        Mood and Affect: Mood normal.     Wt Readings from Last 3 Encounters:  02/02/18 235 lb (106.6 kg)  04/04/17 208  lb (94.3 kg)  05/09/16 185 lb (83.9 kg)    There were no vitals taken for this visit.  Assessment and Plan:

## 2020-10-27 ENCOUNTER — Telehealth: Payer: BLUE CROSS/BLUE SHIELD | Admitting: Physician Assistant

## 2020-10-27 DIAGNOSIS — U071 COVID-19: Secondary | ICD-10-CM

## 2020-10-27 MED ORDER — BENZONATATE 100 MG PO CAPS: 100.0000 mg | ORAL_CAPSULE | Freq: Three times a day (TID) | ORAL | 0 refills | Status: DC | PRN

## 2020-10-27 MED ORDER — ALBUTEROL SULFATE HFA 108 (90 BASE) MCG/ACT IN AERS: 2.0000 | INHALATION_SPRAY | Freq: Four times a day (QID) | RESPIRATORY_TRACT | 0 refills | Status: DC | PRN

## 2020-10-27 MED ORDER — FLUTICASONE PROPIONATE 50 MCG/ACT NA SUSP: 2.0000 | Freq: Every day | NASAL | 0 refills | Status: DC

## 2020-10-27 NOTE — Progress Notes (Signed)
E-Visit  for Positive Covid Test Result  We are sorry you are not feeling well. We are here to help!  You have tested positive for COVID-19, meaning that you were infected with the novel coronavirus and could give the virus to others.  It is vitally important that you stay home so you do not spread it to others.      Please continue isolation at home, for at least 10 days since the start of your symptoms and until you have had 24 hours with no fever (without taking a fever reducer) and with improving of symptoms.  If you have no symptoms but tested positive (or all symptoms resolve after 5 days and you have no fever) you can leave your house but continue to wear a mask around others for an additional 5 days. If you have a fever,continue to stay home until you have had 24 hours of no fever. Most cases improve 5-10 days from onset but we have seen a small number of patients who have gotten worse after the 10 days.  Please be sure to watch for worsening symptoms and remain taking the proper precautions.   Go to the nearest hospital ED for assessment if fever/cough/breathlessness are severe or illness seems like a threat to life.    The following symptoms may appear 2-14 days after exposure: Fever Cough Shortness of breath or difficulty breathing Chills Repeated shaking with chills Muscle pain Headache Sore throat New loss of taste or smell Fatigue Congestion or runny nose Nausea or vomiting Diarrhea  You have been enrolled in MyChart Home Monitoring for COVID-19. Daily you will receive a questionnaire within the MyChart website. Our COVID-19 response team will be monitoring your responses daily.  You can use medication such as prescription cough medication called Tessalon Perles 100 mg. You may take 1-2 capsules every 8 hours as needed for cough,  prescription inhaler called Albuterol MDI 90 mcg /actuation 2 puffs every 4 hours as needed for shortness of breath, wheezing, cough, and  prescription for Fluticasone nasal spray 2 sprays in each nostril one time per day  You may also take acetaminophen (Tylenol) as needed for fever.  If you are interested in antiviral medications, you would need to complete a video visit. This can be done by selecting "virtual urgent care visit" under the MyChart menu. I would like it to be known you would only be a candidate for Molnupiravir. This antiviral has common side effects of nausea and diarrhea. This antiviral also carries birth defect risks. If you are desiring children in the near future, it may not be the best to take the antivirals. You are considered low risk, however obesity and history of asthma do qualify you for the antiviral treatment.   HOME CARE: Only take medications as instructed by your medical team. Drink plenty of fluids and get plenty of rest. A steam or ultrasonic humidifier can help if you have congestion.   GET HELP RIGHT AWAY IF YOU HAVE EMERGENCY WARNING SIGNS.  Call 911 or proceed to your closest emergency facility if: You develop worsening high fever. Trouble breathing Bluish lips or face Persistent pain or pressure in the chest New confusion Inability to wake or stay awake You cough up blood. Your symptoms become more severe Inability to hold down food or fluids  This list is not all possible symptoms. Contact your medical provider for any symptoms that are severe or concerning to you.    Your e-visit answers were reviewed  by a board certified advanced clinical practitioner to complete your personal care plan.  Depending on the condition, your plan could have included both over the counter or prescription medications.  If there is a problem please reply once you have received a response from your provider.  Your safety is important to [place].  If you have drug allergies check your prescription carefully.    You can use MyChart to ask questions about today's visit, request a non-urgent call back, or ask  for a work or school excuse for 24 hours related to this e-Visit. If it has been greater than 24 hours you will need to follow up with your provider, or enter a new e-Visit to address those concerns. You will get an e-mail in the next two days asking about your experience.  I hope that your e-visit has been valuable and will speed your recovery. Thank you for using e-visits.  I provided 7 minutes of non face-to-face time during this encounter for chart review and documentation.

## 2021-05-01 ENCOUNTER — Telehealth: Payer: Commercial Managed Care - PPO | Admitting: Physician Assistant

## 2021-05-01 DIAGNOSIS — U071 COVID-19: Secondary | ICD-10-CM | POA: Diagnosis not present

## 2021-05-01 MED ORDER — PSEUDOEPH-BROMPHEN-DM 30-2-10 MG/5ML PO SYRP: 5.0000 mL | ORAL_SOLUTION | Freq: Four times a day (QID) | ORAL | 0 refills | Status: DC | PRN

## 2021-05-01 MED ORDER — IPRATROPIUM BROMIDE 0.03 % NA SOLN: 2.0000 | Freq: Two times a day (BID) | NASAL | 0 refills | Status: DC

## 2021-05-01 MED ORDER — PREDNISONE 20 MG PO TABS: 40.0000 mg | ORAL_TABLET | Freq: Every day | ORAL | 0 refills | Status: DC

## 2021-05-01 NOTE — Patient Instructions (Signed)
Pennelope Bracken, thank you for joining Margaretann Loveless, PA-C for today's virtual visit.  While this provider is not your primary care provider (PCP), if your PCP is located in our provider database this encounter information will be shared with them immediately following your visit.  Consent: (Patient) Florella Mcneese provided verbal consent for this virtual visit at the beginning of the encounter.  Current Medications:  Current Outpatient Medications:    brompheniramine-pseudoephedrine-DM 30-2-10 MG/5ML syrup, Take 5 mLs by mouth 4 (four) times daily as needed., Disp: 120 mL, Rfl: 0   ipratropium (ATROVENT) 0.03 % nasal spray, Place 2 sprays into both nostrils every 12 (twelve) hours., Disp: 30 mL, Rfl: 0   predniSONE (DELTASONE) 20 MG tablet, Take 2 tablets (40 mg total) by mouth daily with breakfast., Disp: 10 tablet, Rfl: 0   albuterol (VENTOLIN HFA) 108 (90 Base) MCG/ACT inhaler, Inhale 2 puffs into the lungs every 6 (six) hours as needed for wheezing or shortness of breath., Disp: 8 g, Rfl: 0   benzonatate (TESSALON) 100 MG capsule, Take 1 capsule (100 mg total) by mouth 3 (three) times daily as needed., Disp: 30 capsule, Rfl: 0   levocetirizine (XYZAL) 5 MG tablet, Take 5 mg by mouth every evening., Disp: , Rfl:    medroxyPROGESTERone Acetate 150 MG/ML SUSY, ADMINISTER 1 ML(150 MG) IN THE MUSCLE EVERY 3 MONTHS, Disp: 1 mL, Rfl: 0   triamcinolone cream (KENALOG) 0.5 %, Apply 1 application topically 2 (two) times daily., Disp: 30 g, Rfl: 5   Medications ordered in this encounter:  Meds ordered this encounter  Medications   predniSONE (DELTASONE) 20 MG tablet    Sig: Take 2 tablets (40 mg total) by mouth daily with breakfast.    Dispense:  10 tablet    Refill:  0    Order Specific Question:   Supervising Provider    Answer:   Hyacinth Meeker, BRIAN [[id number]]   brompheniramine-pseudoephedrine-DM 30-2-10 MG/5ML syrup    Sig: Take 5 mLs by mouth 4 (four) times daily as needed.    Dispense:  120 mL     Refill:  0    Order Specific Question:   Supervising Provider    Answer:   MILLER, BRIAN [[id number]]   ipratropium (ATROVENT) 0.03 % nasal spray    Sig: Place 2 sprays into both nostrils every 12 (twelve) hours.    Dispense:  30 mL    Refill:  0    Order Specific Question:   Supervising Provider    Answer:   Hyacinth Meeker, BRIAN [[id number]]     *If you need refills on other medications prior to your next appointment, please contact your pharmacy*  Follow-Up: Call back or seek an in-person evaluation if the symptoms worsen or if the condition fails to improve as anticipated.  Other Instructions 10 Things You Can Do to Manage Your COVID-19 Symptoms at Home If you have possible or confirmed COVID-19 Stay home except to get medical care. Monitor your symptoms carefully. If your symptoms get worse, call your healthcare provider immediately. Get rest and stay hydrated. If you have a medical appointment, call the healthcare provider ahead of time and tell them that you have or may have COVID-19. For medical emergencies, call 911 and notify the dispatch personnel that you have or may have COVID-19. Cover your cough and sneezes with a tissue or use the inside of your elbow. Wash your hands often with soap and water for at least 20 seconds or clean your hands with an alcohol-based  hand sanitizer that contains at least 60% alcohol. As much as possible, stay in a specific room and away from other people in your home. Also, you should use a separate bathroom, if available. If you need to be around other people in or outside of the home, wear a mask. Avoid sharing personal items with other people in your household, like dishes, towels, and bedding. Clean all surfaces that are touched often, like counters, tabletops, and doorknobs. Use household cleaning sprays or wipes according to the label instructions. [email or web address] 10/29/2019 This information is not intended to replace advice given to you by your  health care provider. Make sure you discuss any questions you have with your health care provider. Document Revised: 12/22/2020 Document Reviewed: 12/22/2020 Elsevier Patient Education  2022 [employer].    If you have been instructed to have an in-person evaluation today at a local Urgent Care facility, please use the link below. It will take you to a list of all of our available [facility], including address, phone number and hours of operation. Please do not delay care.  [facility]  If you or a family member do not have a primary care provider, use the link below to schedule a visit and establish care. When you choose a [facility] primary care physician or advanced practice provider, you gain a long-term partner in health. Find a Primary Care Provider  Learn more about [facility]'s in-office and virtual care options: [facility] - Get Care Now

## 2021-05-01 NOTE — Progress Notes (Signed)
Virtual Visit Consent   [name], you are scheduled for a virtual visit with a [facility] provider today.     Just as with appointments in the office, your consent must be obtained to participate.  Your consent will be active for this visit and any virtual visit you may have with one of our providers in the next 365 days.     If you have a MyChart account, a copy of this consent can be sent to you electronically.  All virtual visits are billed to your insurance company just like a traditional visit in the office.    As this is a virtual visit, video technology does not allow for your provider to perform a traditional examination.  This may limit your provider's ability to fully assess your condition.  If your provider identifies any concerns that need to be evaluated in person or the need to arrange testing (such as labs, EKG, etc.), we will make arrangements to do so.     Although advances in technology are sophisticated, we cannot ensure that it will always work on either your end or our end.  If the connection with a video visit is poor, the visit may have to be switched to a telephone visit.  With either a video or telephone visit, we are not always able to ensure that we have a secure connection.     I need to obtain your verbal consent now.   Are you willing to proceed with your visit today?    Samamtha Tiegs has provided verbal consent on 05/01/2021 for a virtual visit (video or telephone).   Margaretann Loveless, PA-C   Date: 05/01/2021 8:04 AM   Virtual Visit via Video Note   I, Margaretann Loveless, connected with  Cassandra Barajas  ([id number], Sep 02, 1990) on 05/01/21 at  8:00 AM EST by a video-enabled telemedicine application and verified that I am speaking with the correct person using two identifiers.  Location: Patient: Virtual Visit Location Patient: Home Provider: Virtual Visit Location Provider: Home Office   I discussed the limitations of evaluation and management by  telemedicine and the availability of in person appointments. The patient expressed understanding and agreed to proceed.    History of Present Illness: Cassandra Barajas is a 31 y.o. who identifies as a female who was assigned female at birth, and is being seen today for Covid 49.  HPI: URI  This is a new problem. Episode onset: Tested positive for covid 19 on 04/29/21; Symptoms started om 04/26/21. The problem has been gradually worsening. The maximum temperature recorded prior to her arrival was 101 - 101.9 F. Associated symptoms include congestion, coughing, diarrhea, headaches, neck pain (with coughing), a plugged ear sensation, rhinorrhea, sinus pain, a sore throat and wheezing. Pertinent negatives include no ear pain, nausea or vomiting. Associated symptoms comments: Body aches, chills, sweats, fatigue. She has tried inhaler use (ibuprofen) for the symptoms. The treatment provided no relief.     Problems:  Patient Active Problem List   Diagnosis Date Noted   Mild intermittent asthma without complication 02/02/2018   Uses Depo-Provera as primary birth control method 04/04/2017   S/P bariatric surgery 10/09/2015   Thoracic radiculopathy 10/09/2015   OAB (overactive bladder) 04/20/2015   NCGS (non-celiac gluten sensitivity) 08/04/2014    Allergies:  Allergies  Allergen Reactions   Dog Epithelium Allergy Skin Test    Shellfish Allergy Swelling    "swelling of lips and tongue"   Medications:  Current Outpatient Medications:  brompheniramine-pseudoephedrine-DM 30-2-10 MG/5ML syrup, Take 5 mLs by mouth 4 (four) times daily as needed., Disp: 120 mL, Rfl: 0   ipratropium (ATROVENT) 0.03 % nasal spray, Place 2 sprays into both nostrils every 12 (twelve) hours., Disp: 30 mL, Rfl: 0   predniSONE (DELTASONE) 20 MG tablet, Take 2 tablets (40 mg total) by mouth daily with breakfast., Disp: 10 tablet, Rfl: 0   albuterol (VENTOLIN HFA) 108 (90 Base) MCG/ACT inhaler, Inhale 2 puffs into the lungs every  6 (six) hours as needed for wheezing or shortness of breath., Disp: 8 g, Rfl: 0   benzonatate (TESSALON) 100 MG capsule, Take 1 capsule (100 mg total) by mouth 3 (three) times daily as needed., Disp: 30 capsule, Rfl: 0   levocetirizine (XYZAL) 5 MG tablet, Take 5 mg by mouth every evening., Disp: , Rfl:    medroxyPROGESTERone Acetate 150 MG/ML SUSY, ADMINISTER 1 ML(150 MG) IN THE MUSCLE EVERY 3 MONTHS, Disp: 1 mL, Rfl: 0   triamcinolone cream (KENALOG) 0.5 %, Apply 1 application topically 2 (two) times daily., Disp: 30 g, Rfl: 5  Observations/Objective: Patient is well-developed, well-nourished in no acute distress.  Resting comfortably at home.  Head is normocephalic, atraumatic.  No labored breathing.  Speech is clear and coherent with logical content.  Patient is alert and oriented at baseline.    Assessment and Plan: 1. COVID-19 - predniSONE (DELTASONE) 20 MG tablet; Take 2 tablets (40 mg total) by mouth daily with breakfast.  Dispense: 10 tablet; Refill: 0 - brompheniramine-pseudoephedrine-DM 30-2-10 MG/5ML syrup; Take 5 mLs by mouth 4 (four) times daily as needed.  Dispense: 120 mL; Refill: 0 - ipratropium (ATROVENT) 0.03 % nasal spray; Place 2 sprays into both nostrils every 12 (twelve) hours.  Dispense: 30 mL; Refill: 0  - Continue OTC symptomatic management of choice - Will send OTC vitamins and supplement information through AVS - Bromfed DM, Ipratropium nasal spray, and prednisone prescribed - Patient enrolled in MyChart symptom monitoring - Push fluids - Rest as needed - Discussed return precautions and when to seek in-person evaluation, sent via AVS as well  Follow Up Instructions: I discussed the assessment and treatment plan with the patient. The patient was provided an opportunity to ask questions and all were answered. The patient agreed with the plan and demonstrated an understanding of the instructions.  A copy of instructions were sent to the patient via MyChart  unless otherwise noted below.    The patient was advised to call back or seek an in-person evaluation if the symptoms worsen or if the condition fails to improve as anticipated.  Time:  I spent 12 minutes with the patient via telehealth technology discussing the above problems/concerns.    Margaretann Loveless, PA-C

## 2021-05-08 ENCOUNTER — Encounter: Payer: Self-pay | Admitting: Internal Medicine

## 2021-05-08 ENCOUNTER — Telehealth: Payer: Self-pay | Admitting: Internal Medicine

## 2021-06-07 ENCOUNTER — Other Ambulatory Visit: Payer: Self-pay

## 2021-06-07 ENCOUNTER — Other Ambulatory Visit: Payer: Self-pay | Admitting: Internal Medicine

## 2021-06-07 ENCOUNTER — Ambulatory Visit: Admission: RE | Admit: 2021-06-07 | Payer: Commercial Managed Care - PPO | Source: Ambulatory Visit

## 2021-06-07 ENCOUNTER — Ambulatory Visit (INDEPENDENT_AMBULATORY_CARE_PROVIDER_SITE_OTHER): Payer: Commercial Managed Care - PPO | Admitting: Internal Medicine

## 2021-06-07 ENCOUNTER — Ambulatory Visit
Admission: RE | Admit: 2021-06-07 | Discharge: 2021-06-07 | Disposition: A | Discharge status: Home / Self Care | Payer: Commercial Managed Care - PPO | Source: Ambulatory Visit | Attending: Internal Medicine | Admitting: Internal Medicine

## 2021-06-07 ENCOUNTER — Encounter: Payer: Self-pay | Admitting: Internal Medicine

## 2021-06-07 ENCOUNTER — Ambulatory Visit
Admission: RE | Admit: 2021-06-07 | Discharge: 2021-06-07 | Disposition: A | Discharge status: Home / Self Care | Payer: Commercial Managed Care - PPO | Attending: Internal Medicine | Admitting: Internal Medicine

## 2021-06-07 VITALS — BP 118/76 | HR 77 | Ht 61.0 in | Wt 277.0 lb

## 2021-06-07 DIAGNOSIS — M25561 Pain in right knee: Secondary | ICD-10-CM | POA: Diagnosis present

## 2021-06-07 DIAGNOSIS — G8929 Other chronic pain: Secondary | ICD-10-CM | POA: Diagnosis present

## 2021-06-07 DIAGNOSIS — J4520 Mild intermittent asthma, uncomplicated: Secondary | ICD-10-CM | POA: Diagnosis not present

## 2021-06-07 DIAGNOSIS — Z124 Encounter for screening for malignant neoplasm of cervix: Secondary | ICD-10-CM

## 2021-06-07 DIAGNOSIS — Z1159 Encounter for screening for other viral diseases: Secondary | ICD-10-CM | POA: Diagnosis not present

## 2021-06-07 DIAGNOSIS — Z0000 Encounter for general adult medical examination without abnormal findings: Secondary | ICD-10-CM | POA: Diagnosis not present

## 2021-06-07 MED ORDER — FLUTICASONE PROPIONATE HFA 44 MCG/ACT IN AERO: 2.0000 | INHALATION_SPRAY | Freq: Two times a day (BID) | RESPIRATORY_TRACT | 5 refills | Status: DC

## 2021-06-07 MED ORDER — ALBUTEROL SULFATE HFA 108 (90 BASE) MCG/ACT IN AERS: 2.0000 | INHALATION_SPRAY | Freq: Four times a day (QID) | RESPIRATORY_TRACT | 5 refills | Status: DC | PRN

## 2021-06-07 MED ORDER — ALBUTEROL SULFATE (2.5 MG/3ML) 0.083% IN NEBU: 2.5000 mg | INHALATION_SOLUTION | Freq: Four times a day (QID) | RESPIRATORY_TRACT | 0 refills | Status: AC | PRN

## 2021-06-07 NOTE — Progress Notes (Signed)
Date:  06/07/2021   Name:  Cassandra Barajas   DOB:  Dec 20, 1990   MRN:  [id number]   Chief Complaint: Annual Exam (Breast exam with pap ) Sheilla Maris is a 31 y.o. female who presents today for her Complete Annual Exam. She feels well. She reports exercising none. She reports she is sleeping well. Breast complaints none.  Mammogram: none DEXA: none Pap smear: 2016 Colonoscopy: none  Immunization History  Administered Date(s) Administered   Hepatitis B, ped/adol 09/27/1992, 11/15/1992, 11/13/1993   Influenza,inj,Quad PF,6+ Mos 03/29/2016, 04/04/2017, 05/14/2019   Influenza-Unspecified 12/14/2020   MMR 11/14/1995, 12/18/1996   PFIZER(Purple Top)SARS-COV-2 Vaccination 06/05/2019, 06/26/2019, 02/20/2020   Pneumococcal Polysaccharide-23 05/24/2015   Tdap 09/26/2007    Asthma She complains of wheezing. There is no cough or shortness of breath. This is a recurrent problem. Episode onset: since Covid last July. The problem has been unchanged. Pertinent negatives include no chest pain, fever, headaches or trouble swallowing. Her symptoms are alleviated by beta-agonist. She reports significant improvement on treatment. Risk factors for lung disease include animal exposure. Her past medical history is significant for asthma.  Knee Pain  There was no injury mechanism. The pain is present in the right knee. The quality of the pain is described as aching. The pain is at a severity of 3/10. The pain is moderate. The pain has been Fluctuating since onset. She has tried NSAIDs for the symptoms. The treatment provided moderate relief.   Lab Results  Component Value Date   NA 141 04/04/2017   K 4.3 04/04/2017   CO2 21 04/04/2017   GLUCOSE 68 04/04/2017   BUN 12 04/04/2017   CREATININE 0.51 (L) 04/04/2017   CALCIUM 9.5 04/04/2017   GFRNONAA 134 04/04/2017   Lab Results  Component Value Date   CHOL 130 04/04/2017   HDL 43 04/04/2017   LDLCALC 76 04/04/2017   TRIG 57 04/04/2017    CHOLHDL 3.0 04/04/2017   Lab Results  Component Value Date   TSH 1.680 04/04/2017   No results found for: HGBA1C Lab Results  Component Value Date   WBC 8.8 04/04/2017   HGB 13.0 04/04/2017   HCT 39.8 04/04/2017   MCV 85 04/04/2017   PLT 321 04/04/2017   Lab Results  Component Value Date   ALT 23 04/04/2017   AST 19 04/04/2017   ALKPHOS 85 04/04/2017   BILITOT 0.5 04/04/2017   No results found for: 25OHVITD2, 25OHVITD3, VD25OH   Review of Systems  Constitutional:  Negative for chills, fatigue and fever.  HENT:  Negative for congestion, hearing loss, tinnitus, trouble swallowing and voice change.   Eyes:  Negative for visual disturbance.  Respiratory:  Positive for wheezing. Negative for cough, chest tightness and shortness of breath.   Cardiovascular:  Negative for chest pain, palpitations and leg swelling.  Gastrointestinal:  Negative for abdominal pain, constipation, diarrhea and vomiting.  Endocrine: Negative for polydipsia and polyuria.  Genitourinary:  Negative for dysuria, frequency, genital sores, vaginal bleeding and vaginal discharge.  Musculoskeletal:  Negative for arthralgias, gait problem and joint swelling.  Skin:  Negative for color change and rash.  Neurological:  Negative for dizziness, tremors, light-headedness and headaches.  Hematological:  Negative for adenopathy. Does not bruise/bleed easily.  Psychiatric/Behavioral:  Negative for dysphoric mood and sleep disturbance. The patient is not nervous/anxious.    Patient Active Problem List   Diagnosis Date Noted   Mild intermittent asthma without complication 09/98/3382   S/P bariatric surgery 10/09/2015   Thoracic  radiculopathy 10/09/2015   OAB (overactive bladder) 04/20/2015   NCGS (non-celiac gluten sensitivity) 08/04/2014    Allergies  Allergen Reactions   Dog Epithelium Allergy Skin Test    Shellfish Allergy Swelling    "swelling of lips and tongue"    Past Surgical History:   Procedure Laterality Date   CHOLECYSTECTOMY  09/2008   Gastric sleeve with HH repair  05/2015   LAPAROSCOPIC GASTRIC SLEEVE RESECTION WITH HIATAL HERNIA REPAIR N/A 05/23/2015   Procedure: LAPAROSCOPIC GASTRIC SLEEVE RESECTION WITH HIATAL HERNIA REPAIR;  Surgeon: Ladora Daniel, MD;  Location: [facility] ORS;  Service: General;  Laterality: N/A;    Social History   Tobacco Use   Smoking status: Never   Smokeless tobacco: Never  Substance Use Topics   Alcohol use: Yes    Alcohol/week: 0.0 standard drinks    Comment: occ.   Drug use: No     Medication list has been reviewed and updated.  Current Meds  Medication Sig   albuterol (VENTOLIN HFA) 108 (90 Base) MCG/ACT inhaler Inhale 2 puffs into the lungs every 6 (six) hours as needed for wheezing or shortness of breath.   ipratropium (ATROVENT) 0.03 % nasal spray Place 2 sprays into both nostrils every 12 (twelve) hours. (Patient taking differently: Place 2 sprays into both nostrils as needed.)   levocetirizine (XYZAL) 5 MG tablet Take 5 mg by mouth every evening.   triamcinolone cream (KENALOG) 0.5 % Apply 1 application topically 2 (two) times daily.   valACYclovir (VALTREX) 1000 MG tablet Take 1,000 mg by mouth daily.   [DISCONTINUED] clindamycin (CLEOCIN) 300 MG capsule Take 300 mg by mouth 3 (three) times daily.    PHQ 2/9 Scores 04/04/2017 10/09/2015  PHQ - 2 Score 0 0    No flowsheet data found.  BP Readings from Last 3 Encounters:  06/07/21 118/76  02/02/18 128/74  04/04/17 128/64    Physical Exam Vitals and nursing note reviewed.  Constitutional:      General: She is not in acute distress.    Appearance: She is well-developed.  HENT:     Head: Normocephalic and atraumatic.     Right Ear: Tympanic membrane and ear canal normal.     Left Ear: Tympanic membrane and ear canal normal.     Nose:     Right Sinus: No maxillary sinus tenderness.     Left Sinus: No maxillary sinus tenderness.  Eyes:     General:  No scleral icterus.       Right eye: No discharge.        Left eye: No discharge.     Conjunctiva/sclera: Conjunctivae normal.  Neck:     Thyroid: No thyromegaly.     Vascular: No carotid bruit.  Cardiovascular:     Rate and Rhythm: Normal rate and regular rhythm.     Pulses: Normal pulses.     Heart sounds: Normal heart sounds.  Pulmonary:     Effort: Pulmonary effort is normal. No respiratory distress.     Breath sounds: No wheezing or rhonchi.  Chest:  Breasts:    Right: No mass, nipple discharge, skin change or tenderness.     Left: No mass, nipple discharge, skin change or tenderness.  Abdominal:     General: Bowel sounds are normal.     Palpations: Abdomen is soft.     Tenderness: There is no abdominal tenderness.  Musculoskeletal:     Cervical back: Normal range of motion. No erythema.     Right  knee: No swelling, bony tenderness or crepitus. Normal range of motion.     Right lower leg: No edema.     Left lower leg: No edema.  Lymphadenopathy:     Cervical: No cervical adenopathy.  Skin:    General: Skin is warm and dry.     Findings: No rash.  Neurological:     Mental Status: She is alert and oriented to person, place, and time.     Cranial Nerves: No cranial nerve deficit.     Sensory: No sensory deficit.     Deep Tendon Reflexes: Reflexes are normal and symmetric.  Psychiatric:        Attention and Perception: Attention normal.        Mood and Affect: Mood normal.    Wt Readings from Last 3 Encounters:  06/07/21 277 lb (125.6 kg)  02/02/18 235 lb (106.6 kg)  04/04/17 208 lb (94.3 kg)    BP 118/76    Pulse 77    Ht 5' 1"  (1.549 m)    Wt 277 lb (125.6 kg)    SpO2 96%    BMI 52.34 kg/m   Assessment and Plan: 1. Annual physical exam Exam is normal except for weight. Encourage regular exercise and appropriate dietary changes.  - Comprehensive metabolic panel - Lipid panel - TSH  2. Encounter for screening for cervical cancer Recommend GYN evaluation  and pap smear in the near future  3. Mild intermittent asthma without complication Begin daily steroid inhaler - Flovent sent but not covered - asked pt to obtain list of covered medications from insurance. - CBC with Differential/Platelet - fluticasone (FLOVENT HFA) 44 MCG/ACT inhaler; Inhale 2 puffs into the lungs 2 (two) times daily.  Dispense: 1 each; Refill: 5 - albuterol (PROVENTIL) (2.5 MG/3ML) 0.083% nebulizer solution; Take 3 mLs (2.5 mg total) by nebulization every 6 (six) hours as needed for wheezing or shortness of breath.  Dispense: 150 mL; Refill: 0 - albuterol (VENTOLIN HFA) 108 (90 Base) MCG/ACT inhaler; Inhale 2 puffs into the lungs every 6 (six) hours as needed for wheezing or shortness of breath.  Dispense: 8 g; Refill: 5  4. Need for hepatitis C screening test - Hepatitis C antibody  5. Chronic pain of right knee Intermittent worsening with swelling and pain. Will get plain films to better advise on next steps - DG Knee Complete 4 Views Right   Partially dictated using Dragon software. Any errors are unintentional.  Halina Maidens, MD [facility]  06/07/2021

## 2021-06-08 LAB — COMPREHENSIVE METABOLIC PANEL
ALT: 25 IU/L (ref 0–32)
AST: 20 IU/L (ref 0–40)
Albumin/Globulin Ratio: 1.7 (ref 1.2–2.2)
Albumin: 4.3 g/dL (ref 3.9–5.0)
Alkaline Phosphatase: 118 IU/L (ref 44–121)
BUN/Creatinine Ratio: 19 (ref 9–23)
BUN: 11 mg/dL (ref 6–20)
Bilirubin Total: 0.4 mg/dL (ref 0.0–1.2)
CO2: 23 mmol/L (ref 20–29)
Calcium: 9.1 mg/dL (ref 8.7–10.2)
Chloride: 103 mmol/L (ref 96–106)
Creatinine, Ser: 0.57 mg/dL (ref 0.57–1.00)
Globulin, Total: 2.5 g/dL (ref 1.5–4.5)
Glucose: 91 mg/dL (ref 70–99)
Potassium: 4.4 mmol/L (ref 3.5–5.2)
Sodium: 141 mmol/L (ref 134–144)
Total Protein: 6.8 g/dL (ref 6.0–8.5)
eGFR: 126 mL/min/{1.73_m2} (ref >59)

## 2021-06-08 LAB — LIPID PANEL
Chol/HDL Ratio: 3.9 ratio (ref 0.0–4.4)
Cholesterol, Total: 141 mg/dL (ref 100–199)
HDL: 36 mg/dL — ABNORMAL LOW (ref >39)
LDL Chol Calc (NIH): 88 mg/dL (ref 0–99)
Triglycerides: 89 mg/dL (ref 0–149)
VLDL Cholesterol Cal: 17 mg/dL (ref 5–40)

## 2021-06-08 LAB — CBC WITH DIFFERENTIAL/PLATELET
Basophils Absolute: 0.1 10*3/uL (ref 0.0–0.2)
Basos: 1 %
EOS (ABSOLUTE): 0.7 10*3/uL — ABNORMAL HIGH (ref 0.0–0.4)
Eos: 7 %
Hematocrit: 38.2 % (ref 34.0–46.6)
Hemoglobin: 12.4 g/dL (ref 11.1–15.9)
Immature Grans (Abs): 0 10*3/uL (ref 0.0–0.1)
Immature Granulocytes: 0 %
Lymphocytes Absolute: 3 10*3/uL (ref 0.7–3.1)
Lymphs: 28 %
MCH: 26.4 pg — ABNORMAL LOW (ref 26.6–33.0)
MCHC: 32.5 g/dL (ref 31.5–35.7)
MCV: 81 fL (ref 79–97)
Monocytes Absolute: 0.7 10*3/uL (ref 0.1–0.9)
Monocytes: 7 %
Neutrophils Absolute: 6.3 10*3/uL (ref 1.4–7.0)
Neutrophils: 57 %
Platelets: 413 10*3/uL (ref 150–450)
RBC: 4.69 x10E6/uL (ref 3.77–5.28)
RDW: 13.7 % (ref 11.7–15.4)
WBC: 10.9 10*3/uL — ABNORMAL HIGH (ref 3.4–10.8)

## 2021-06-08 LAB — TSH: TSH: 1.75 u[IU]/mL (ref 0.450–4.500)

## 2021-06-08 LAB — HEPATITIS C ANTIBODY: Hep C Virus Ab: NONREACTIVE

## 2021-06-10 ENCOUNTER — Other Ambulatory Visit: Payer: Self-pay | Admitting: Physician Assistant

## 2021-06-10 DIAGNOSIS — U071 COVID-19: Secondary | ICD-10-CM

## 2022-05-31 ENCOUNTER — Encounter: Payer: Self-pay | Admitting: Internal Medicine

## 2022-05-31 ENCOUNTER — Other Ambulatory Visit: Payer: Self-pay

## 2022-05-31 MED ORDER — MEDROXYPROGESTERONE ACETATE 150 MG/ML IM SUSY: PREFILLED_SYRINGE | INTRAMUSCULAR | 0 refills | Status: DC

## 2022-06-05 ENCOUNTER — Other Ambulatory Visit: Payer: Self-pay | Admitting: Internal Medicine

## 2022-06-05 ENCOUNTER — Other Ambulatory Visit: Payer: Self-pay

## 2022-06-05 MED ORDER — MEDROXYPROGESTERONE ACETATE 150 MG/ML IM SUSY: PREFILLED_SYRINGE | INTRAMUSCULAR | 0 refills | Status: DC

## 2022-06-05 NOTE — Telephone Encounter (Signed)
Duplicate request- signed by provider today Requested Prescriptions  Pending Prescriptions Disp Refills   medroxyPROGESTERone Acetate 150 MG/ML SUSY 1 mL 0    Sig: ADMINISTER 1 ML(150 MG) IN THE MUSCLE EVERY 3 MONTHS     Off-Protocol Failed - 06/05/2022  2:48 PM      Failed - Medication not assigned to a protocol, review manually.      Failed - Valid encounter within last 12 months    Recent Outpatient Visits           12 months ago Annual physical exam   [facility], Jesse Sans, MD   2 years ago Uses Depo-Provera as primary birth control [place] [facility], Jesse Sans, MD   4 years ago Mild intermittent asthma without complication   [facility], Jesse Sans, MD   5 years ago Annual physical exam   [place] [facility], Jesse Sans, MD   6 years ago Encounter for other contraceptive management   [place] at [facility], Jesse Sans, MD       Future Appointments             In 2 days Army Melia Jesse Sans, MD [place] [facility], [facility]

## 2022-06-05 NOTE — Telephone Encounter (Signed)
Copied from [facility] [phone]. Topic: General - Other >> Jun 05, 2022  2:29 PM Everette C wrote: Reason for CRM: Medication Refill - Medication: medroxyPROGESTERone (DEPO-PROVERA) injection 150 mg  Has the patient contacted their pharmacy? Yes.   (Agent: If no, request that the patient contact the pharmacy for the refill. If patient does not wish to contact the pharmacy document the reason why and proceed with request.) (Agent: If yes, when and what did the pharmacy advise?)  Preferred Pharmacy (with phone number or street name): [id number] [address]Phone: [phone]: [phone]: Not open 24 hours   Has the patient been seen for an appointment in the last year OR does the patient have an upcoming appointment? Yes.    Agent: Please be advised that RX refills may take up to 3 business days. We ask that you follow-up with your pharmacy.

## 2022-06-06 NOTE — Progress Notes (Deleted)
Date:  06/07/2022   Name:  Cassandra Barajas   DOB:  09/06/1990   MRN:  [id number]   Chief Complaint: No chief complaint on file. Cassandra Barajas is a 32 y.o. female who presents today for her Complete Annual Exam. She feels {DESC; WELL/FAIRLY WELL/POORLY:18703}. She reports exercising ***. She reports she is sleeping {DESC; WELL/FAIRLY WELL/POORLY:18703}. Breast complaints ***.   Pap smear: 09/2014 On Depo Provera for contraception.  Health Maintenance Due  Topic Date Due   DTaP/Tdap/Td (2 - Td or Tdap) 09/25/2017   PAP SMEAR-Modifier  10/11/2019   INFLUENZA VACCINE  11/13/2021   COVID-19 Vaccine (4 - 2023-24 season) 12/14/2021    Immunization History  Administered Date(s) Administered   Hepatitis B, PED/ADOLESCENT 09/27/1992, 11/15/1992, 11/13/1993   Influenza,inj,Quad PF,6+ Mos 03/29/2016, 04/04/2017, 05/14/2019   Influenza-Unspecified 12/14/2020   MMR 11/14/1995, 12/18/1996   PFIZER(Purple Top)SARS-COV-2 Vaccination 06/05/2019, 06/26/2019, 02/20/2020   Pneumococcal Polysaccharide-23 05/24/2015   Tdap 09/26/2007    Asthma She complains of chest tightness and wheezing. This is a recurrent problem. The problem occurs rarely. Her symptoms are alleviated by beta-agonist. Her past medical history is significant for asthma.    Lab Results  Component Value Date   NA 141 06/07/2021   K 4.4 06/07/2021   CO2 23 06/07/2021   GLUCOSE 91 06/07/2021   BUN 11 06/07/2021   CREATININE 0.57 06/07/2021   CALCIUM 9.1 06/07/2021   EGFR 126 06/07/2021   GFRNONAA 134 04/04/2017   Lab Results  Component Value Date   CHOL 141 06/07/2021   HDL 36 (L) 06/07/2021   LDLCALC 88 06/07/2021   TRIG 89 06/07/2021   CHOLHDL 3.9 06/07/2021   Lab Results  Component Value Date   TSH 1.750 06/07/2021   No results found for: "HGBA1C" Lab Results  Component Value Date   WBC 10.9 (H) 06/07/2021   HGB 12.4 06/07/2021   HCT 38.2 06/07/2021   MCV 81 06/07/2021   PLT 413 06/07/2021   Lab Results   Component Value Date   ALT 25 06/07/2021   AST 20 06/07/2021   ALKPHOS 118 06/07/2021   BILITOT 0.4 06/07/2021   No results found for: "25OHVITD2", "25OHVITD3", "VD25OH"   Review of Systems  Respiratory:  Positive for wheezing.     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    Allergies  Allergen Reactions   Dog Epithelium Allergy Skin Test    Shellfish Allergy Swelling    "swelling of lips and tongue"    Past Surgical History:  Procedure Laterality Date   CHOLECYSTECTOMY  09/2008   Gastric sleeve with HH repair  05/2015   LAPAROSCOPIC GASTRIC SLEEVE RESECTION WITH HIATAL HERNIA REPAIR N/A 05/23/2015   Procedure: LAPAROSCOPIC GASTRIC SLEEVE RESECTION WITH HIATAL HERNIA REPAIR;  Surgeon: Cassandra Daniel, MD;  Location: [facility] ORS;  Service: General;  Laterality: N/A;    Social History   Tobacco Use   Smoking status: Never   Smokeless tobacco: Never  Substance Use Topics   Alcohol use: Yes    Alcohol/week: 0.0 standard drinks of alcohol    Comment: occ.   Drug use: No     Medication list has been reviewed and updated.  No outpatient medications have been marked as taking for the 06/07/22 encounter (Appointment) with Glean Hess, MD.  06/07/2021    9:49 AM  GAD 7 : Generalized Anxiety Score  Nervous, Anxious, on Edge 0  Control/stop worrying 0  Worry too much - different things 0  Trouble relaxing 0  Restless 0  Easily annoyed or irritable 0  Afraid - awful might happen 0  Total GAD 7 Score 0  Anxiety Difficulty Not difficult at all       06/07/2021    9:49 AM 04/04/2017   11:10 AM 10/09/2015   10:39 AM  Depression screen PHQ 2/9  Decreased Interest 0 0 0  Down, Depressed, Hopeless 0 0 0  PHQ - 2 Score 0 0 0  Altered  sleeping 2    Tired, decreased energy 0    Change in appetite 0    Feeling bad or failure about yourself  0    Trouble concentrating 0    Moving slowly or fidgety/restless 0    Suicidal thoughts 0    PHQ-9 Score 2    Difficult doing work/chores Not difficult at all      BP Readings from Last 3 Encounters:  06/07/21 118/76  02/02/18 128/74  04/04/17 128/64    Physical Exam  Wt Readings from Last 3 Encounters:  06/07/21 277 lb (125.6 kg)  02/02/18 235 lb (106.6 kg)  04/04/17 208 lb (94.3 kg)    There were no vitals taken for this visit.  Assessment and Plan:

## 2022-06-06 NOTE — Assessment & Plan Note (Deleted)
Asthma is well controlled on current regimen Continue PRN Albuterol

## 2022-06-07 ENCOUNTER — Encounter: Payer: Commercial Managed Care - PPO | Admitting: Internal Medicine

## 2022-06-07 ENCOUNTER — Telehealth: Payer: 59 | Admitting: Physician Assistant

## 2022-06-07 DIAGNOSIS — A084 Viral intestinal infection, unspecified: Secondary | ICD-10-CM | POA: Diagnosis not present

## 2022-06-07 MED ORDER — ONDANSETRON 4 MG PO TBDP: 4.0000 mg | ORAL_TABLET | Freq: Three times a day (TID) | ORAL | 0 refills | Status: DC | PRN

## 2022-06-07 NOTE — Progress Notes (Signed)
E-Visit for Vomiting  We are sorry that you are not feeling well. Here is how we plan to help!  Based on what you have shared with me it looks like you have a Virus that is irritating your GI tract.  Vomiting is the forceful emptying of a portion of the stomach's content through the mouth.  Although nausea and vomiting can make you feel miserable, it's important to remember that these are not diseases, but rather symptoms of an underlying illness.  When we treat short term symptoms, we always caution that any symptoms that persist should be fully evaluated in a medical office.  I have prescribed a medication that will help alleviate your symptoms and allow you to stay hydrated:  Zofran 4 mg 1 tablet every 8 hours as needed for nausea and vomiting  I recommend starting OTC Famotidine (Pepcid) once daily for the next few days as well to reduce effects of stomach acid for now while things are calming down.   HOME CARE: Drink clear liquids.  This is very important! Dehydration (the lack of fluid) can lead to a serious complication.  Start off with 1 tablespoon every 5 minutes for 8 hours. You may begin eating bland foods after 8 hours without vomiting.  Start with saltine crackers, white bread, rice, mashed potatoes, applesauce. After 48 hours on a bland diet, you may resume a normal diet. Try to go to sleep.  Sleep often empties the stomach and relieves the need to vomit.  GET HELP RIGHT AWAY IF:  Your symptoms do not improve or worsen within 2 days after treatment. You have a fever for over 3 days. You cannot keep down fluids after trying the medication.  MAKE SURE YOU:  Understand these instructions. Will watch your condition. Will get help right away if you are not doing well or get worse.   Thank you for choosing an e-visit.  Your e-visit answers were reviewed by a board certified advanced clinical practitioner to complete your personal care plan. Depending upon the condition, your  plan could have included both over the counter or prescription medications.  Please review your pharmacy choice. Make sure the pharmacy is open so you can pick up prescription now. If there is a problem, you may contact your provider through [employer] and have the prescription routed to another pharmacy.  Your safety is important to [place]. If you have drug allergies check your prescription carefully.   For the next 24 hours you can use MyChart to ask questions about today's visit, request a non-urgent call back, or ask for a work or school excuse. You will get an email in the next two days asking about your experience. I hope that your e-visit has been valuable and will speed your recovery.

## 2022-06-07 NOTE — Progress Notes (Signed)
Message sent to patient requesting further input regarding current symptoms. Awaiting patient response.  

## 2022-06-07 NOTE — Progress Notes (Signed)
I have spent 5 minutes in review of e-visit questionnaire, review and updating patient chart, medical decision making and response to patient.   Bessye Stith Cody Adhira Jamil, PA-C    

## 2022-06-16 NOTE — Assessment & Plan Note (Signed)
Symptoms controlled with Ditropan - started by GYN several years ago

## 2022-06-16 NOTE — Progress Notes (Unsigned)
Date:  06/17/2022   Name:  Cassandra Barajas   DOB:  [birth year]   MRN:  [id number]   Chief Complaint: No chief complaint on file. Cassandra Barajas is a 32 y.o. female who presents today for her Complete Annual Exam. She feels {DESC; WELL/FAIRLY WELL/POORLY:18703}. She reports exercising ***. She reports she is sleeping {DESC; WELL/FAIRLY WELL/POORLY:18703}. Breast complaints ***. She is on Depo provera q 3 mo.  Pap smear: 2018 Colonoscopy: none  Health Maintenance Due  Topic Date Due   DTaP/Tdap/Td (2 - Td or Tdap) 09/25/2017   PAP SMEAR-Modifier  10/11/2019   INFLUENZA VACCINE  11/13/2021   COVID-19 Vaccine (4 - 2023-24 season) 12/14/2021    Immunization History  Administered Date(s) Administered   Hepatitis B, PED/ADOLESCENT 09/27/1992, 11/15/1992, 11/13/1993   Influenza,inj,Quad PF,6+ Mos 03/29/2016, 04/04/2017, 05/14/2019   Influenza-Unspecified 12/14/2020   MMR 11/14/1995, 12/18/1996   PFIZER(Purple Top)SARS-COV-2 Vaccination 06/05/2019, 06/26/2019, 02/20/2020   Pneumococcal Polysaccharide-23 05/24/2015   Tdap 09/26/2007    Asthma She complains of chest tightness and cough. There is no shortness of breath or wheezing. This is a recurrent problem. The problem occurs rarely. Pertinent negatives include no chest pain, fever, headaches or trouble swallowing. Her symptoms are alleviated by beta-agonist. Her past medical history is significant for asthma.    Lab Results  Component Value Date   NA 141 06/07/2021   K 4.4 06/07/2021   CO2 23 06/07/2021   GLUCOSE 91 06/07/2021   BUN 11 06/07/2021   CREATININE 0.57 06/07/2021   CALCIUM 9.1 06/07/2021   EGFR 126 06/07/2021   GFRNONAA 134 04/04/2017   Lab Results  Component Value Date   CHOL 141 06/07/2021   HDL 36 (L) 06/07/2021   LDLCALC 88 06/07/2021   TRIG 89 06/07/2021   CHOLHDL 3.9 06/07/2021   Lab Results  Component Value Date   TSH 1.750 06/07/2021   No results found for: "HGBA1C" Lab Results  Component Value  Date   WBC 10.9 (H) 06/07/2021   HGB 12.4 06/07/2021   HCT 38.2 06/07/2021   MCV 81 06/07/2021   PLT 413 06/07/2021   Lab Results  Component Value Date   ALT 25 06/07/2021   AST 20 06/07/2021   ALKPHOS 118 06/07/2021   BILITOT 0.4 06/07/2021   No results found for: "25OHVITD2", "25OHVITD3", "VD25OH"   Review of Systems  Constitutional:  Negative for chills, fatigue and fever.  HENT:  Negative for congestion, hearing loss, tinnitus, trouble swallowing and voice change.   Eyes:  Negative for visual disturbance.  Respiratory:  Positive for cough. Negative for chest tightness, shortness of breath and wheezing.   Cardiovascular:  Negative for chest pain, palpitations and leg swelling.  Gastrointestinal:  Negative for abdominal pain, constipation, diarrhea and vomiting.  Endocrine: Negative for polydipsia and polyuria.  Genitourinary:  Negative for dysuria, frequency, genital sores, vaginal bleeding and vaginal discharge.  Musculoskeletal:  Negative for arthralgias, gait problem and joint swelling.  Skin:  Negative for color change and rash.  Neurological:  Negative for dizziness, tremors, light-headedness and headaches.  Hematological:  Negative for adenopathy. Does not bruise/bleed easily.  Psychiatric/Behavioral:  Negative for dysphoric mood and sleep disturbance. The patient is not nervous/anxious.     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    Allergies  Allergen  Reactions   Cat Hair Extract Itching   Dog Epithelium Allergy Skin Test    Shellfish Allergy Swelling    "swelling of lips and tongue"   Shrimp Extract Allergy Skin Test Itching    Past Surgical History:  Procedure Laterality Date   CHOLECYSTECTOMY  09/2008   Gastric sleeve with HH  repair  05/2015   LAPAROSCOPIC GASTRIC SLEEVE RESECTION WITH HIATAL HERNIA REPAIR N/A 05/23/2015   Procedure: LAPAROSCOPIC GASTRIC SLEEVE RESECTION WITH HIATAL HERNIA REPAIR;  Surgeon: Ladora Daniel, MD;  Location: [facility] ORS;  Service: General;  Laterality: N/A;    Social History   Tobacco Use   Smoking status: Never   Smokeless tobacco: Never  Substance Use Topics   Alcohol use: Yes    Alcohol/week: 0.0 standard drinks of alcohol    Comment: occ.   Drug use: No     Medication list has been reviewed and updated.  No outpatient medications have been marked as taking for the 06/17/22 encounter (Appointment) with Glean Hess, MD.       06/07/2021    9:49 AM  GAD 7 : Generalized Anxiety Score  Nervous, Anxious, on Edge 0  Control/stop worrying 0  Worry too much - different things 0  Trouble relaxing 0  Restless 0  Easily annoyed or irritable 0  Afraid - awful might happen 0  Total GAD 7 Score 0  Anxiety Difficulty Not difficult at all       06/07/2021    9:49 AM 04/04/2017   11:10 AM 10/09/2015   10:39 AM  Depression screen PHQ 2/9  Decreased Interest 0 0 0  Down, Depressed, Hopeless 0 0 0  PHQ - 2 Score 0 0 0  Altered sleeping 2    Tired, decreased energy 0    Change in appetite 0    Feeling bad or failure about yourself  0    Trouble concentrating 0    Moving slowly or fidgety/restless 0    Suicidal thoughts 0    PHQ-9 Score 2    Difficult doing work/chores Not difficult at all      BP Readings from Last 3 Encounters:  06/07/21 118/76  02/02/18 128/74  04/04/17 128/64    Physical Exam Vitals and nursing note reviewed.  Constitutional:      General: She is not in acute distress.    Appearance: She is well-developed.  HENT:     Head: Normocephalic and atraumatic.     Right Ear: Tympanic membrane and ear canal normal.     Left Ear: Tympanic membrane and ear canal normal.     Nose:     Right Sinus: No maxillary sinus tenderness.     Left Sinus: No  maxillary sinus tenderness.  Eyes:     General: No scleral icterus.       Right eye: No discharge.        Left eye: No discharge.     Conjunctiva/sclera: Conjunctivae normal.  Neck:     Thyroid: No thyromegaly.     Vascular: No carotid bruit.  Cardiovascular:     Rate and Rhythm: Normal rate and regular rhythm.     Pulses: Normal pulses.     Heart sounds: Normal heart sounds.  Pulmonary:     Effort: Pulmonary effort is normal. No respiratory distress.     Breath sounds: No wheezing.  Chest:  Breasts:    Right: No mass, nipple discharge, skin change or tenderness.     Left: No  mass, nipple discharge, skin change or tenderness.  Abdominal:     General: Bowel sounds are normal.     Palpations: Abdomen is soft.     Tenderness: There is no abdominal tenderness.  Genitourinary:    Labia:        Right: No tenderness, lesion or injury.        Left: No tenderness, lesion or injury.      Vagina: Normal.     Cervix: Normal.     Uterus: Normal.      Adnexa: Right adnexa normal and left adnexa normal.     Comments: Pap obtained Musculoskeletal:     Cervical back: Normal range of motion. No erythema.     Right lower leg: No edema.     Left lower leg: No edema.  Lymphadenopathy:     Cervical: No cervical adenopathy.  Skin:    General: Skin is warm and dry.     Findings: No rash.  Neurological:     Mental Status: She is alert and oriented to person, place, and time.     Cranial Nerves: No cranial nerve deficit.     Sensory: No sensory deficit.     Deep Tendon Reflexes: Reflexes are normal and symmetric.  Psychiatric:        Attention and Perception: Attention normal.        Mood and Affect: Mood normal.     Wt Readings from Last 3 Encounters:  06/07/21 277 lb (125.6 kg)  02/02/18 235 lb (106.6 kg)  04/04/17 208 lb (94.3 kg)    There were no vitals taken for this visit.  Assessment and Plan: Problem List Items Addressed This Visit       Respiratory   Mild intermittent  asthma without complication (Chronic)    Asthma well controlled with albuterol PRN; previously on Flovent but med not covered. Allergic symptoms managed with Flonase      Relevant Medications   montelukast (SINGULAIR) 10 MG tablet     Genitourinary   OAB (overactive bladder)    Symptoms controlled with Ditropan - started by GYN several years ago        Other   S/P bariatric surgery   Other Visit Diagnoses     Annual physical exam    -  Primary   Encounter for screening for cervical cancer       Screening for diabetes mellitus       Screening for lipid disorders            Partially dictated using Dragon software. Any errors are unintentional.  Halina Maidens, MD [facility]  06/16/2022

## 2022-06-16 NOTE — Assessment & Plan Note (Addendum)
Asthma well controlled with albuterol PRN; previously on Flovent but med not covered. Will start Asmanex Allergic symptoms managed with Flonase

## 2022-06-17 ENCOUNTER — Other Ambulatory Visit (HOSPITAL_COMMUNITY)
Admission: RE | Admit: 2022-06-17 | Discharge: 2022-06-17 | Disposition: A | Discharge status: Home / Self Care | Payer: BC Managed Care – PPO | Source: Ambulatory Visit | Attending: Internal Medicine | Admitting: Internal Medicine

## 2022-06-17 ENCOUNTER — Encounter: Payer: Self-pay | Admitting: Internal Medicine

## 2022-06-17 ENCOUNTER — Ambulatory Visit (INDEPENDENT_AMBULATORY_CARE_PROVIDER_SITE_OTHER): Payer: 59 | Admitting: Internal Medicine

## 2022-06-17 VITALS — BP 114/76 | HR 74 | Ht 61.0 in | Wt 272.0 lb

## 2022-06-17 DIAGNOSIS — Z13220 Encounter for screening for lipoid disorders: Secondary | ICD-10-CM | POA: Diagnosis not present

## 2022-06-17 DIAGNOSIS — N3281 Overactive bladder: Secondary | ICD-10-CM | POA: Diagnosis not present

## 2022-06-17 DIAGNOSIS — Z3042 Encounter for surveillance of injectable contraceptive: Secondary | ICD-10-CM

## 2022-06-17 DIAGNOSIS — Z0000 Encounter for general adult medical examination without abnormal findings: Secondary | ICD-10-CM

## 2022-06-17 DIAGNOSIS — Z124 Encounter for screening for malignant neoplasm of cervix: Secondary | ICD-10-CM

## 2022-06-17 DIAGNOSIS — Z9884 Bariatric surgery status: Secondary | ICD-10-CM

## 2022-06-17 DIAGNOSIS — J4520 Mild intermittent asthma, uncomplicated: Secondary | ICD-10-CM | POA: Diagnosis not present

## 2022-06-17 DIAGNOSIS — Z131 Encounter for screening for diabetes mellitus: Secondary | ICD-10-CM | POA: Diagnosis not present

## 2022-06-17 LAB — POCT URINE PREGNANCY: Preg Test, Ur: NEGATIVE

## 2022-06-17 MED ORDER — ASMANEX (60 METERED DOSES) 220 MCG/ACT IN AEPB: 2.0000 | INHALATION_SPRAY | Freq: Every day | RESPIRATORY_TRACT | 12 refills | Status: DC

## 2022-06-17 MED ORDER — MEDROXYPROGESTERONE ACETATE 150 MG/ML IM SUSP
150.0000 mg | Freq: Once | INTRAMUSCULAR | Status: AC
  Administered 2022-06-17: 150 mg via INTRAMUSCULAR

## 2022-06-17 MED ORDER — TRIAMCINOLONE ACETONIDE 0.5 % EX CREA: 1.0000 | TOPICAL_CREAM | Freq: Two times a day (BID) | CUTANEOUS | 3 refills | Status: AC

## 2022-06-18 LAB — CYTOLOGY - PAP
Adequacy: ABSENT
Comment: NEGATIVE
Diagnosis: NEGATIVE
High risk HPV: NEGATIVE

## 2022-07-26 ENCOUNTER — Encounter: Payer: Self-pay | Admitting: Internal Medicine

## 2022-08-31 ENCOUNTER — Other Ambulatory Visit: Payer: Self-pay | Admitting: Internal Medicine

## 2022-09-05 ENCOUNTER — Other Ambulatory Visit: Payer: Self-pay | Admitting: Internal Medicine

## 2022-09-05 ENCOUNTER — Ambulatory Visit (INDEPENDENT_AMBULATORY_CARE_PROVIDER_SITE_OTHER): Payer: 59

## 2022-09-05 DIAGNOSIS — Z3042 Encounter for surveillance of injectable contraceptive: Secondary | ICD-10-CM

## 2022-09-05 MED ORDER — MEDROXYPROGESTERONE ACETATE 150 MG/ML IM SUSY: PREFILLED_SYRINGE | INTRAMUSCULAR | Status: DC

## 2022-09-05 MED ORDER — MEDROXYPROGESTERONE ACETATE 150 MG/ML IM SUSY: 150.0000 mg | PREFILLED_SYRINGE | Freq: Once | INTRAMUSCULAR | 0 refills | Status: DC

## 2022-11-13 ENCOUNTER — Telehealth: Payer: 59 | Admitting: Internal Medicine

## 2022-12-02 ENCOUNTER — Ambulatory Visit: Payer: 59

## 2022-12-10 ENCOUNTER — Ambulatory Visit (INDEPENDENT_AMBULATORY_CARE_PROVIDER_SITE_OTHER): Payer: 59

## 2022-12-10 DIAGNOSIS — Z3042 Encounter for surveillance of injectable contraceptive: Secondary | ICD-10-CM | POA: Diagnosis not present

## 2022-12-10 MED ORDER — MEDROXYPROGESTERONE ACETATE 150 MG/ML IM SUSP
150.0000 mg | Freq: Once | INTRAMUSCULAR | Status: AC
  Administered 2022-12-10: 150 mg via INTRAMUSCULAR

## 2023-02-20 ENCOUNTER — Other Ambulatory Visit: Payer: Self-pay | Admitting: Internal Medicine

## 2023-02-20 DIAGNOSIS — Z3042 Encounter for surveillance of injectable contraceptive: Secondary | ICD-10-CM

## 2023-02-20 MED ORDER — MEDROXYPROGESTERONE ACETATE 150 MG/ML IM SUSY: 150.0000 mg | PREFILLED_SYRINGE | Freq: Once | INTRAMUSCULAR | 0 refills | Status: DC

## 2023-02-25 ENCOUNTER — Ambulatory Visit (INDEPENDENT_AMBULATORY_CARE_PROVIDER_SITE_OTHER): Payer: 59

## 2023-02-25 DIAGNOSIS — Z3042 Encounter for surveillance of injectable contraceptive: Secondary | ICD-10-CM | POA: Diagnosis not present

## 2023-02-25 MED ORDER — MEDROXYPROGESTERONE ACETATE 150 MG/ML IM SUSP
150.0000 mg | Freq: Once | INTRAMUSCULAR | Status: AC
  Administered 2023-02-25: 150 mg via INTRAMUSCULAR

## 2023-03-20 ENCOUNTER — Ambulatory Visit: Payer: Self-pay

## 2023-03-20 ENCOUNTER — Ambulatory Visit: Payer: 59 | Admitting: Internal Medicine

## 2023-03-20 VITALS — BP 126/78 | HR 100 | Ht 61.0 in | Wt 275.0 lb

## 2023-03-20 DIAGNOSIS — J4520 Mild intermittent asthma, uncomplicated: Secondary | ICD-10-CM | POA: Diagnosis not present

## 2023-03-20 MED ORDER — WIXELA INHUB 100-50 MCG/ACT IN AEPB: 1.0000 | INHALATION_SPRAY | Freq: Two times a day (BID) | RESPIRATORY_TRACT | 0 refills | Status: AC

## 2023-03-20 MED ORDER — ALBUTEROL SULFATE HFA 108 (90 BASE) MCG/ACT IN AERS: 2.0000 | INHALATION_SPRAY | Freq: Four times a day (QID) | RESPIRATORY_TRACT | 5 refills | Status: AC | PRN

## 2023-03-20 MED ORDER — MONTELUKAST SODIUM 10 MG PO TABS: 10.0000 mg | ORAL_TABLET | Freq: Every day | ORAL | 0 refills | Status: AC

## 2023-03-20 NOTE — Assessment & Plan Note (Addendum)
Increase in wheezing and shortness of breath with recent weather change. Currently only on albuterol prn. Will start Singulair every day and Wixela bid.  She will call if these are not covered or not affordable.

## 2023-03-20 NOTE — Telephone Encounter (Signed)
  Chief Complaint: SOB, chest tightness, feel like something stuck in her throat. Dry cough Symptoms: above Frequency: september Pertinent Negatives: Patient denies fever Disposition: [] ED /[] Urgent Care (no appt availability in office) / [x] Appointment(In office/virtual)/ []  [facility] [place] [facility]/ [] Home Care/ [] Refused Recommended Disposition /[] [place] [facility]/ []  Follow-up with PCP Additional Notes: Pt has had this since September, but symptoms seem to be increasing. Pt was prescribed and inhaler after a VV, but pt was unable to fill d/t cost.    Reason for Disposition  [1] MILD difficulty breathing (e.g., minimal/no SOB at rest, SOB with walking, pulse <100) AND [2] NEW-onset or WORSE than normal  Answer Assessment - Initial Assessment Questions 1. RESPIRATORY STATUS: "Describe your breathing?" (e.g., wheezing, shortness of breath, unable to speak, severe coughing)      Dry cough 2. ONSET: "When did this breathing problem begin?"      Awhile a goes 3. PATTERN "Does the difficult breathing come and go, or has it been constant since it started?"      Comes and goes 4. SEVERITY: "How bad is your breathing?" (e.g., mild, moderate, severe)    - MILD: No SOB at rest, mild SOB with walking, speaks normally in sentences, can lie down, no retractions, pulse < 100.    - MODERATE: SOB at rest, SOB with minimal exertion and prefers to sit, cannot lie down flat, speaks in phrases, mild retractions, audible wheezing, pulse 100-120.    - SEVERE: Very SOB at rest, speaks in single words, struggling to breathe, sitting hunched forward, retractions, pulse > 120      Mild - with exertion it is moderate 5. RECURRENT SYMPTOM: "Have you had difficulty breathing before?" If Yes, ask: "When was the last time?" and "What happened that time?"      yes 6. CARDIAC HISTORY: "Do you have any history of heart disease?" (e.g., heart attack, angina, bypass surgery, angioplasty)      no 7. LUNG HISTORY: "Do  you have any history of lung disease?"  (e.g., pulmonary embolus, asthma, emphysema)     asthma 8. CAUSE: "What do you think is causing the breathing problem?"      unsure 9. OTHER SYMPTOMS: "Do you have any other symptoms? (e.g., dizziness, runny nose, cough, chest pain, fever)     Chest tightness - dry  cough  Protocols used: Breathing Difficulty-A-AH

## 2023-03-20 NOTE — Progress Notes (Signed)
Date:  03/20/2023   Name:  Cassandra Barajas   DOB:  1990-11-07   MRN:  [id number]   Chief Complaint: Wheezing (Started on and off this week. The cold makes her Sob worse. Dry cough, wheezing, and dry heaving.  Patient was prescribed Flovent in the past, and this is too expensive.)  Wheezing  This is a recurrent problem. The problem has been rapidly worsening. Associated symptoms include shortness of breath and vomiting. Pertinent negatives include no abdominal pain, chills, coughing, diarrhea, fever, headaches, hemoptysis or sputum production. The symptoms are aggravated by weather changes.    Review of Systems  Constitutional:  Negative for chills, fatigue and fever.  Respiratory:  Positive for shortness of breath and wheezing. Negative for cough, hemoptysis and sputum production.   Gastrointestinal:  Positive for vomiting. Negative for abdominal pain and diarrhea.  Neurological:  Negative for dizziness, light-headedness and headaches.  Psychiatric/Behavioral:  Negative for dysphoric mood and sleep disturbance. The patient is not nervous/anxious.      Lab Results  Component Value Date   NA 141 06/07/2021   K 4.4 06/07/2021   CO2 23 06/07/2021   GLUCOSE 91 06/07/2021   BUN 11 06/07/2021   CREATININE 0.57 06/07/2021   CALCIUM 9.1 06/07/2021   EGFR 126 06/07/2021   GFRNONAA 134 04/04/2017   Lab Results  Component Value Date   CHOL 141 06/07/2021   HDL 36 (L) 06/07/2021   LDLCALC 88 06/07/2021   TRIG 89 06/07/2021   CHOLHDL 3.9 06/07/2021   Lab Results  Component Value Date   TSH 1.750 06/07/2021   No results found for: "HGBA1C" Lab Results  Component Value Date   WBC 10.9 (H) 06/07/2021   HGB 12.4 06/07/2021   HCT 38.2 06/07/2021   MCV 81 06/07/2021   PLT 413 06/07/2021   Lab Results  Component Value Date   ALT 25 06/07/2021   AST 20 06/07/2021   ALKPHOS 118 06/07/2021   BILITOT 0.4 06/07/2021   No results found for: "25OHVITD2", "25OHVITD3", "VD25OH"    Patient Active Problem List   Diagnosis Date Noted   Mild intermittent asthma without complication 02/02/2018   S/P bariatric surgery 10/09/2015   Thoracic radiculopathy 10/09/2015   OAB (overactive bladder) 04/20/2015   NCGS (non-celiac gluten sensitivity) 08/04/2014    Allergies  Allergen Reactions   Cat Hair Extract Itching   Dog Epithelium (Canis Lupus Familiaris)    Shellfish Allergy Swelling    "swelling of lips and tongue"   Shrimp Extract Itching    Past Surgical History:  Procedure Laterality Date   CHOLECYSTECTOMY  09/2008   Gastric sleeve with HH repair  05/2015   LAPAROSCOPIC GASTRIC SLEEVE RESECTION WITH HIATAL HERNIA REPAIR N/A 05/23/2015   Procedure: LAPAROSCOPIC GASTRIC SLEEVE RESECTION WITH HIATAL HERNIA REPAIR;  Surgeon: Everette Rank, MD;  Location: [facility] ORS;  Service: General;  Laterality: N/A;    Social History   Tobacco Use   Smoking status: Never   Smokeless tobacco: Never  Substance Use Topics   Alcohol use: Yes    Alcohol/week: 0.0 standard drinks of alcohol    Comment: occ.   Drug use: No     Medication list has been reviewed and updated.  Current Meds  Medication Sig   albuterol (PROVENTIL) (2.5 MG/3ML) 0.083% nebulizer solution Take 3 mLs (2.5 mg total) by nebulization every 6 (six) hours as needed for wheezing or shortness of breath.   EPINEPHrine 0.3 mg/0.3 mL IJ SOAJ injection INJ UTD PRF  SYSTEMIC REACTION   fluticasone-salmeterol (WIXELA INHUB) 100-50 MCG/ACT AEPB Inhale 1 puff into the lungs 2 (two) times daily.   ipratropium (ATROVENT) 0.03 % nasal spray USE 2 SPRAYS IN EACH NOSTRIL EVERY 12 HOURS   levocetirizine (XYZAL) 5 MG tablet Take 5 mg by mouth every evening.   medroxyPROGESTERone Acetate 150 MG/ML SUSY Inject 1 mL (150 mg total) into the muscle once for 1 dose.   montelukast (SINGULAIR) 10 MG tablet Take 1 tablet (10 mg total) by mouth at bedtime.   triamcinolone cream (KENALOG) 0.5 % Apply 1 Application topically 2 (two)  times daily.   valACYclovir (VALTREX) 1000 MG tablet Take 1,000 mg by mouth daily.   [DISCONTINUED] albuterol (VENTOLIN HFA) 108 (90 Base) MCG/ACT inhaler Inhale 2 puffs into the lungs every 6 (six) hours as needed for wheezing or shortness of breath.       03/20/2023   11:08 AM 06/17/2022    8:50 AM 06/07/2021    9:49 AM  GAD 7 : Generalized Anxiety Score  Nervous, Anxious, on Edge 0 1 0  Control/stop worrying 0 0 0  Worry too much - different things 0 0 0  Trouble relaxing 0 1 0  Restless 0 1 0  Easily annoyed or irritable 0 1 0  Afraid - awful might happen 0 0 0  Total GAD 7 Score 0 4 0  Anxiety Difficulty Not difficult at all Not difficult at all Not difficult at all       03/20/2023   11:08 AM 06/17/2022    8:50 AM 06/07/2021    9:49 AM  Depression screen PHQ 2/9  Decreased Interest 0 0 0  Down, Depressed, Hopeless 0 0 0  PHQ - 2 Score 0 0 0  Altered sleeping 0 0 2  Tired, decreased energy 0 2 0  Change in appetite 0 2 0  Feeling bad or failure about yourself  0 0 0  Trouble concentrating 0 0 0  Moving slowly or fidgety/restless 0 0 0  Suicidal thoughts 0 0 0  PHQ-9 Score 0 4 2  Difficult doing work/chores Not difficult at all Not difficult at all Not difficult at all    BP Readings from Last 3 Encounters:  03/20/23 126/78  06/17/22 114/76  06/07/21 118/76    Physical Exam Constitutional:      Appearance: Normal appearance.  Cardiovascular:     Rate and Rhythm: Normal rate and regular rhythm.     Heart sounds: No murmur heard. Pulmonary:     Effort: Pulmonary effort is normal. No accessory muscle usage, prolonged expiration or respiratory distress.     Breath sounds: No stridor. Wheezing present. No rhonchi.  Musculoskeletal:     Cervical back: Normal range of motion.  Lymphadenopathy:     Cervical: No cervical adenopathy.  Neurological:     General: No focal deficit present.     Mental Status: She is alert and oriented to person, place, and time.      Wt Readings from Last 3 Encounters:  03/20/23 275 lb (124.7 kg)  06/17/22 272 lb (123.4 kg)  06/07/21 277 lb (125.6 kg)    BP 126/78   Pulse 100   Ht 5\' 1"  (1.549 m)   Wt 275 lb (124.7 kg)   SpO2 100%   BMI 51.96 kg/m   Assessment and Plan:  Problem List Items Addressed This Visit       Unprioritized   Mild intermittent asthma without complication - Primary (Chronic)  Increase in wheezing and shortness of breath with recent weather change. Currently only on albuterol prn. Will start Singulair every day and Wixela bid.  She will call if these are not covered or not affordable.      Relevant Medications   fluticasone-salmeterol (WIXELA INHUB) 100-50 MCG/ACT AEPB   montelukast (SINGULAIR) 10 MG tablet   albuterol (VENTOLIN HFA) 108 (90 Base) MCG/ACT inhaler    No follow-ups on file.    Reubin Milan, MD [facility] [place]

## 2023-03-21 ENCOUNTER — Encounter: Payer: Self-pay | Admitting: Internal Medicine

## 2023-05-13 ENCOUNTER — Ambulatory Visit: Payer: 59

## 2023-05-15 ENCOUNTER — Other Ambulatory Visit: Payer: Self-pay | Admitting: Internal Medicine

## 2023-05-15 DIAGNOSIS — Z3042 Encounter for surveillance of injectable contraceptive: Secondary | ICD-10-CM

## 2023-05-16 NOTE — Telephone Encounter (Signed)
Requested medication (s) are due for refill today: yes  Requested medication (s) are on the active medication list: yes  Last refill:  02/20/23  Future visit scheduled: yes  Notes to clinic:   Medication not assigned to a protocol, review manually      Requested Prescriptions  Pending Prescriptions Disp Refills   medroxyPROGESTERone Acetate 150 MG/ML SUSY [Pharmacy Med Name: MEDROXYPROGESTERONE 150 MG/ML]      Sig: Inject 1 mL (150 mg total) into the muscle once for 1 dose.     Off-Protocol Failed - 05/16/2023  9:10 AM      Failed - Medication not assigned to a protocol, review manually.      Passed - Valid encounter within last 12 months    Recent Outpatient Visits           1 month ago Mild intermittent asthma without complication   [place] [facility] at [facility], Nyoka Cowden, MD   11 months ago Annual physical exam   [facility] at [facility], Nyoka Cowden, MD   1 year ago Annual physical exam   [facility] at [facility], Nyoka Cowden, MD   3 years ago Uses Depo-Provera as primary birth control method   [facility] at [facility], Nyoka Cowden, MD   5 years ago Mild intermittent asthma without complication   [facility] at [facility], Nyoka Cowden, MD       Future Appointments             In 1 month Judithann Graves, Nyoka Cowden, MD [facility] at [facility], [facility]

## 2023-05-19 IMAGING — CR DG KNEE COMPLETE 4+V*R*
4 series · 4 of 4 positions shown · non-contrast
Comparison: None.

CLINICAL DATA: Chronic right anterolateral knee pain for over a
year.

EXAM:
RIGHT KNEE - COMPLETE 4+ VIEW

[knee ap]
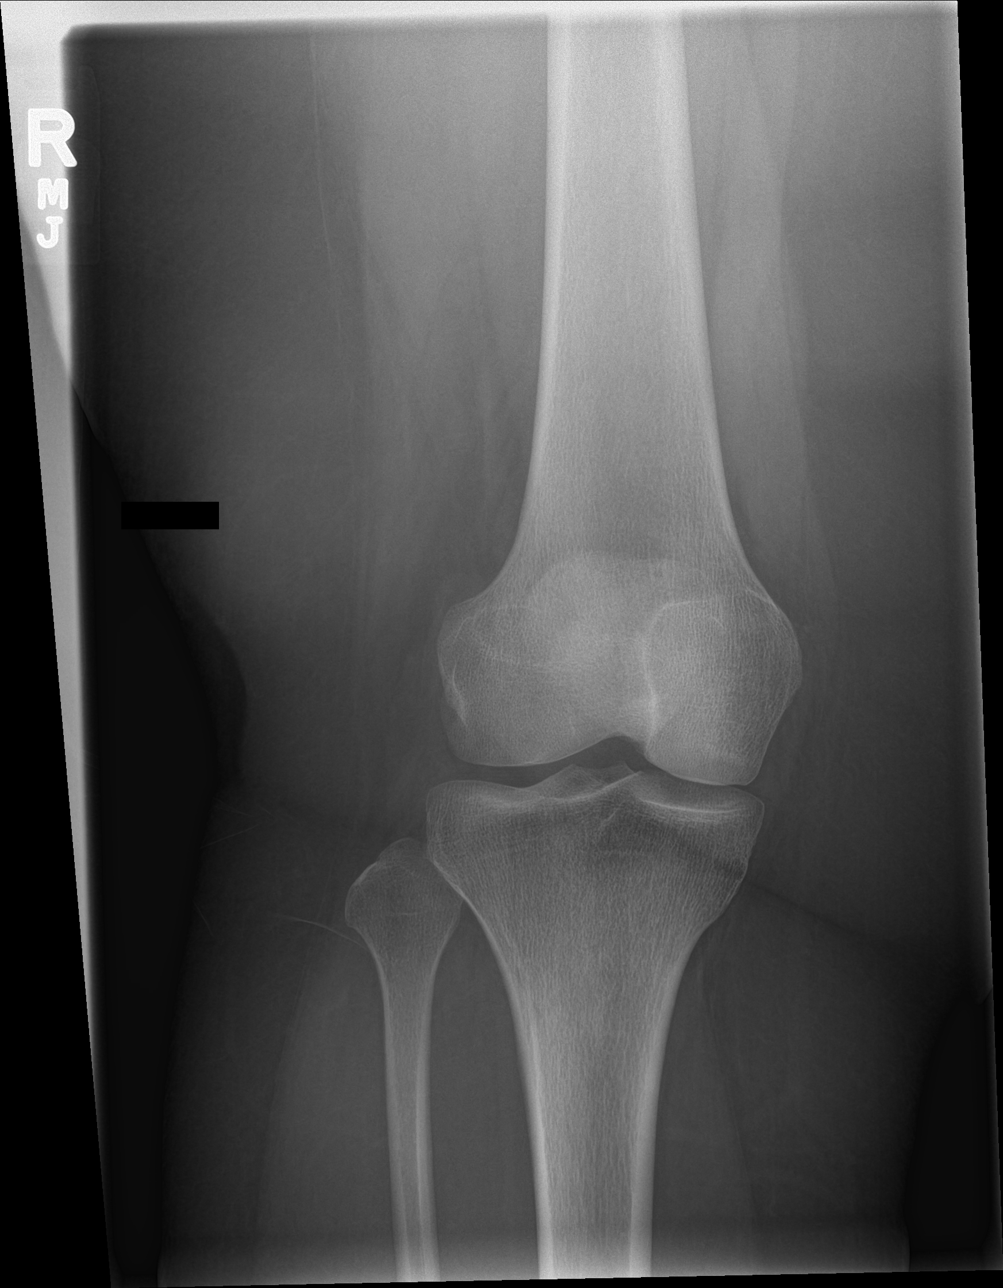

[knee lat]
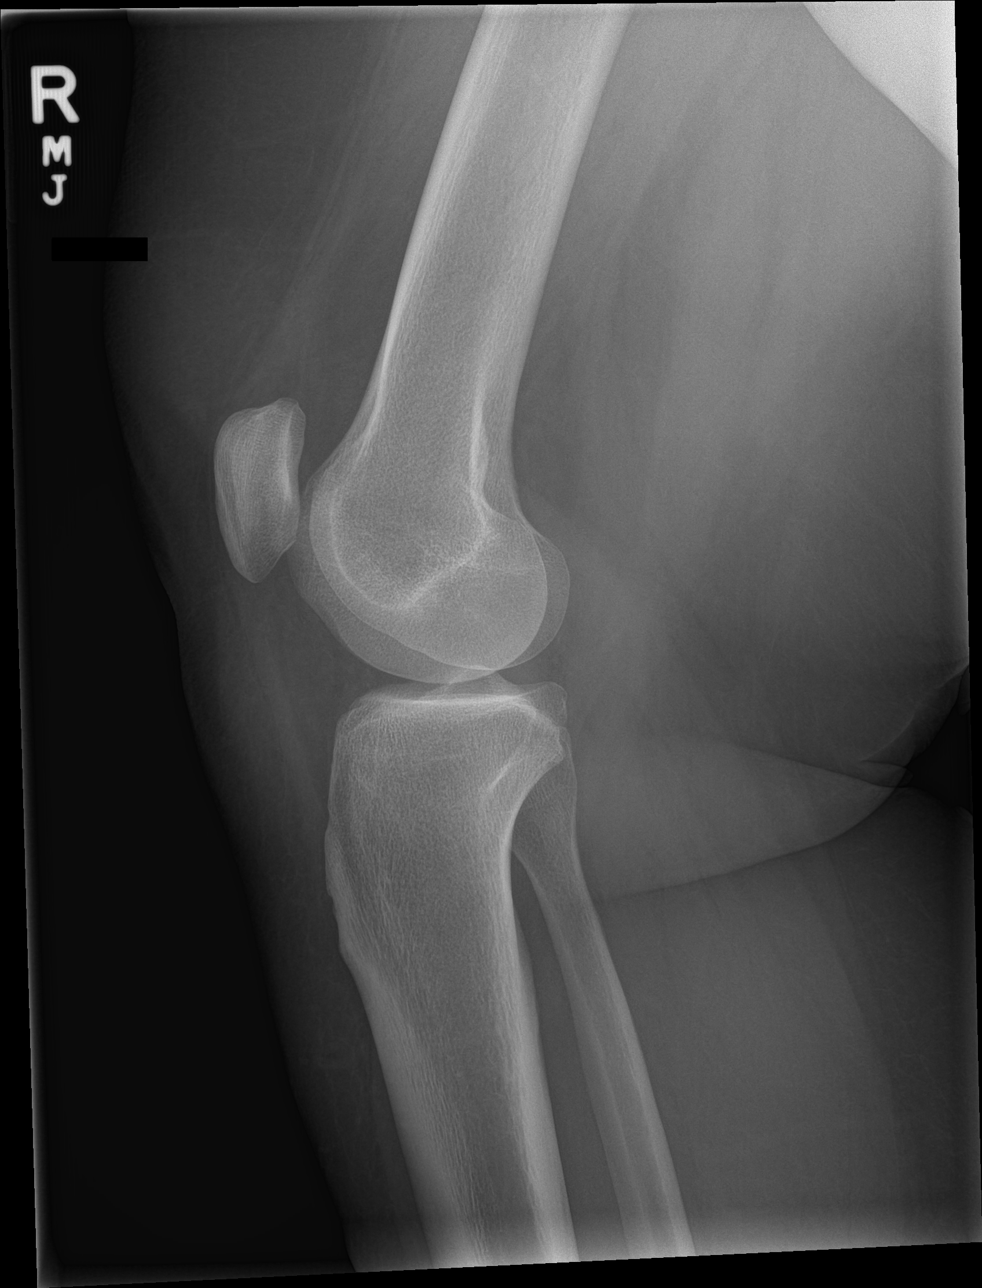

[tunnel]
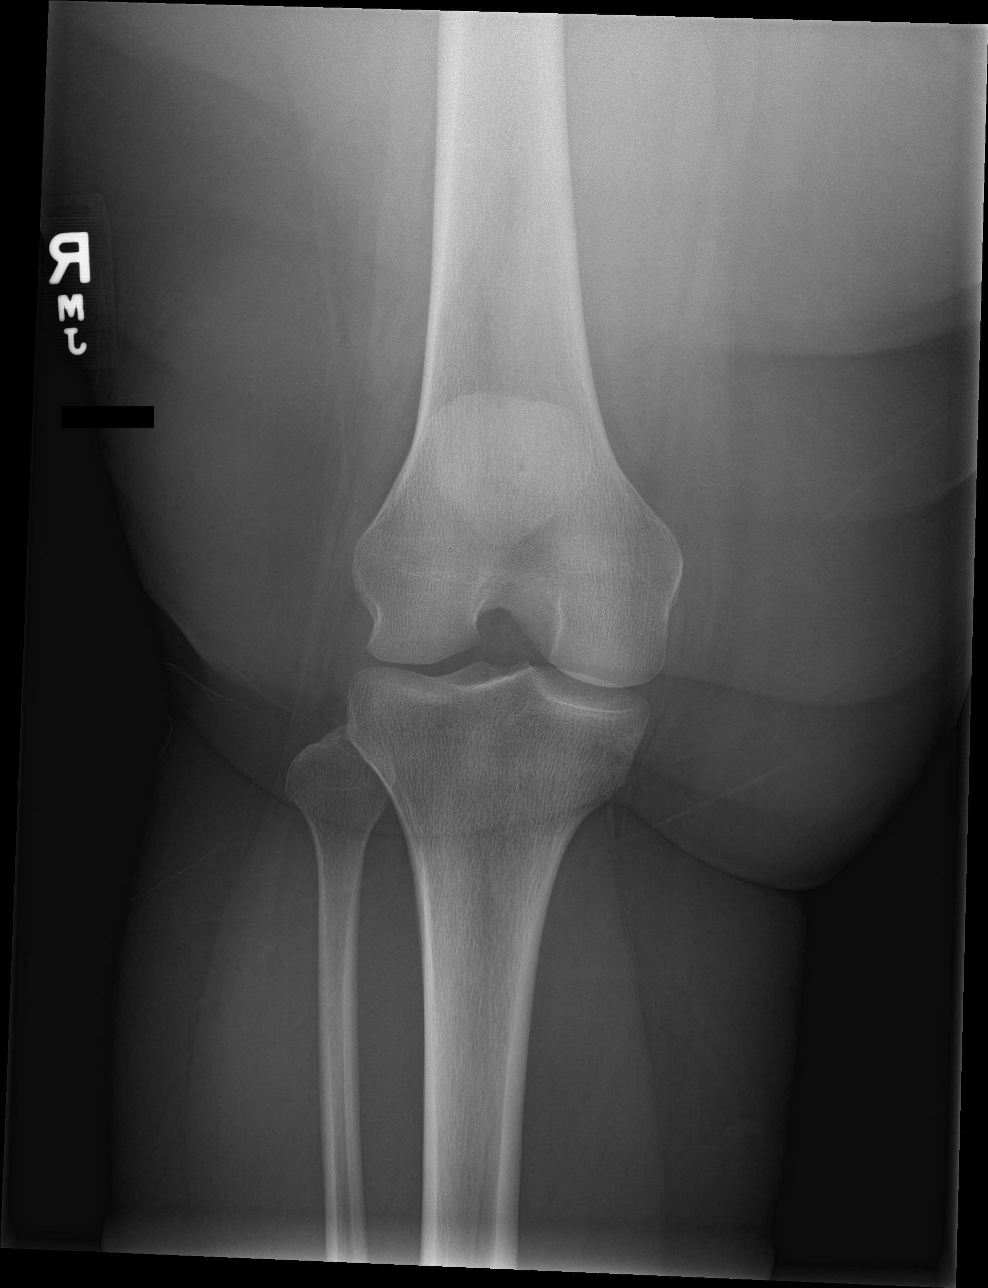

[patella skyline]
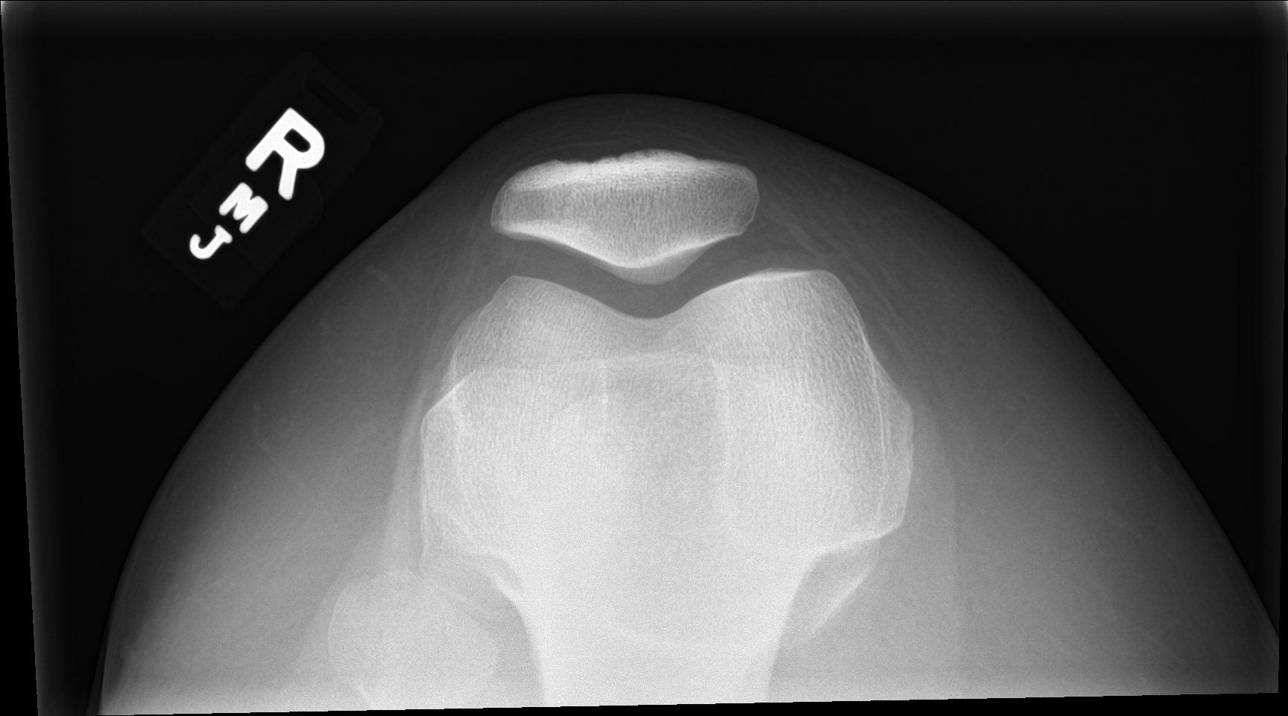

[4 of 4 positions shown; findings below may reference images not displayed]

FINDINGS: No evidence of fracture, dislocation, or joint effusion. Normal
alignment. Normal joint spaces. No evidence of arthropathy or other
focal bone abnormality. Soft tissues are unremarkable.
IMPRESSION: Negative radiographs of the right knee.

## 2023-05-22 ENCOUNTER — Ambulatory Visit: Payer: 59

## 2023-05-22 DIAGNOSIS — Z3042 Encounter for surveillance of injectable contraceptive: Secondary | ICD-10-CM

## 2023-05-22 MED ORDER — MEDROXYPROGESTERONE ACETATE 150 MG/ML IM SUSP
150.0000 mg | Freq: Once | INTRAMUSCULAR | Status: AC
  Administered 2023-05-22: 150 mg via INTRAMUSCULAR

## 2023-05-22 NOTE — Progress Notes (Signed)
 Patient is in office today for a nurse visit for Birth Control Injection. Patient Injection was given in the  Right vastus lateralis. Patient tolerated injection well.

## 2023-06-23 ENCOUNTER — Encounter: Payer: Self-pay | Admitting: Internal Medicine

## 2023-08-04 ENCOUNTER — Encounter: Payer: Self-pay | Admitting: Internal Medicine

## 2023-08-04 ENCOUNTER — Other Ambulatory Visit: Payer: Self-pay

## 2023-08-04 DIAGNOSIS — Z3042 Encounter for surveillance of injectable contraceptive: Secondary | ICD-10-CM

## 2023-08-04 MED ORDER — MEDROXYPROGESTERONE ACETATE 150 MG/ML IM SUSY: 150.0000 mg | PREFILLED_SYRINGE | Freq: Once | INTRAMUSCULAR | 0 refills | Status: AC

## 2023-08-07 ENCOUNTER — Ambulatory Visit: Payer: 59

## 2023-08-07 DIAGNOSIS — Z3042 Encounter for surveillance of injectable contraceptive: Secondary | ICD-10-CM

## 2023-08-07 MED ORDER — MEDROXYPROGESTERONE ACETATE 150 MG/ML IM SUSP
150.0000 mg | Freq: Once | INTRAMUSCULAR | Status: AC
  Administered 2023-08-07: 150 mg via INTRAMUSCULAR

## 2023-08-07 NOTE — Progress Notes (Signed)
 Depo- provera  administered.  JM

## 2023-10-23 ENCOUNTER — Ambulatory Visit

## 2023-11-10 ENCOUNTER — Other Ambulatory Visit: Payer: Self-pay | Admitting: Medical Genetics

## 2023-11-12 ENCOUNTER — Other Ambulatory Visit
Admission: RE | Admit: 2023-11-12 | Discharge: 2023-11-12 | Disposition: A | Discharge status: Home / Self Care | Payer: Self-pay | Source: Ambulatory Visit | Attending: Medical Genetics | Admitting: Medical Genetics

## 2023-11-23 LAB — GENECONNECT MOLECULAR SCREEN: Genetic Analysis Overall Interpretation: NEGATIVE
